# Patient Record
Sex: Female | Born: 1990 | Race: Black or African American | Hispanic: No | Smoking: Never smoker
Health system: Southern US, Community
[De-identification: ages and names within clinical notes are randomized; demographics above are authoritative.]

## PROBLEM LIST (undated history)

## (undated) ENCOUNTER — Inpatient Hospital Stay (HOSPITAL_COMMUNITY): Payer: Self-pay

## (undated) DIAGNOSIS — F419 Anxiety disorder, unspecified: Secondary | ICD-10-CM

## (undated) DIAGNOSIS — J45909 Unspecified asthma, uncomplicated: Secondary | ICD-10-CM

## (undated) HISTORY — PX: NO PAST SURGERIES: SHX2092

## (undated) HISTORY — DX: Anxiety disorder, unspecified: F41.9

---
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
## 2009-10-14 ENCOUNTER — Emergency Department (HOSPITAL_COMMUNITY): Admission: EM | Admit: 2009-10-14 | Discharge: 2009-10-14 | Payer: Self-pay | Admitting: Emergency Medicine

## 2010-09-03 LAB — URINE MICROSCOPIC-ADD ON

## 2010-09-03 LAB — URINALYSIS, ROUTINE W REFLEX MICROSCOPIC
Bilirubin Urine: NEGATIVE
Specific Gravity, Urine: >1.03 — ABNORMAL HIGH (ref 1.005–1.030)

## 2010-09-03 LAB — URINE CULTURE

## 2010-09-03 LAB — PREGNANCY, URINE: Preg Test, Ur: NEGATIVE

## 2010-10-23 ENCOUNTER — Emergency Department (HOSPITAL_COMMUNITY)
Admission: EM | Admit: 2010-10-23 | Discharge: 2010-10-23 | Disposition: A | Discharge status: Home / Self Care | Payer: Self-pay | Attending: Emergency Medicine | Admitting: Emergency Medicine

## 2010-10-23 DIAGNOSIS — Z79899 Other long term (current) drug therapy: Secondary | ICD-10-CM | POA: Insufficient documentation

## 2010-10-23 DIAGNOSIS — J45909 Unspecified asthma, uncomplicated: Secondary | ICD-10-CM | POA: Insufficient documentation

## 2010-10-23 DIAGNOSIS — N390 Urinary tract infection, site not specified: Secondary | ICD-10-CM | POA: Insufficient documentation

## 2010-10-23 LAB — URINALYSIS, ROUTINE W REFLEX MICROSCOPIC
Bilirubin Urine: NEGATIVE
Glucose, UA: NEGATIVE mg/dL
Ketones, ur: NEGATIVE mg/dL
Nitrite: POSITIVE — AB
Urobilinogen, UA: 1 mg/dL (ref 0.0–1.0)

## 2010-10-23 LAB — URINE MICROSCOPIC-ADD ON

## 2010-10-25 LAB — URINE CULTURE

## 2011-03-21 LAB — URINALYSIS, ROUTINE W REFLEX MICROSCOPIC
Bilirubin Urine: NEGATIVE
Ketones, ur: NEGATIVE
Leukocytes, UA: NEGATIVE
Protein, ur: 30 — AB
Urobilinogen, UA: 0.2

## 2011-03-21 LAB — URINE MICROSCOPIC-ADD ON

## 2011-03-21 LAB — PREGNANCY, URINE: Preg Test, Ur: NEGATIVE

## 2012-01-20 ENCOUNTER — Other Ambulatory Visit (HOSPITAL_COMMUNITY)
Admission: RE | Admit: 2012-01-20 | Discharge: 2012-01-20 | Disposition: A | Discharge status: Home / Self Care | Payer: Medicaid Other | Source: Ambulatory Visit | Attending: Obstetrics and Gynecology | Admitting: Obstetrics and Gynecology

## 2012-01-20 ENCOUNTER — Other Ambulatory Visit: Payer: Self-pay | Admitting: Adult Health

## 2012-01-20 DIAGNOSIS — Z01419 Encounter for gynecological examination (general) (routine) without abnormal findings: Secondary | ICD-10-CM | POA: Insufficient documentation

## 2012-01-20 DIAGNOSIS — Z113 Encounter for screening for infections with a predominantly sexual mode of transmission: Secondary | ICD-10-CM | POA: Insufficient documentation

## 2012-01-20 LAB — OB RESULTS CONSOLE VARICELLA ZOSTER ANTIBODY, IGG: Varicella: IMMUNE

## 2012-01-20 LAB — OB RESULTS CONSOLE RUBELLA ANTIBODY, IGM: Rubella: IMMUNE

## 2012-01-20 LAB — OB RESULTS CONSOLE ABO/RH: RH Type: POSITIVE

## 2012-02-01 ENCOUNTER — Emergency Department (HOSPITAL_COMMUNITY)
Admission: EM | Admit: 2012-02-01 | Discharge: 2012-02-01 | Disposition: A | Discharge status: Home / Self Care | Payer: Medicaid Other | Attending: Emergency Medicine | Admitting: Emergency Medicine

## 2012-02-01 ENCOUNTER — Encounter (HOSPITAL_COMMUNITY): Payer: Self-pay | Admitting: *Deleted

## 2012-02-01 DIAGNOSIS — O209 Hemorrhage in early pregnancy, unspecified: Secondary | ICD-10-CM | POA: Insufficient documentation

## 2012-02-01 DIAGNOSIS — O200 Threatened abortion: Secondary | ICD-10-CM

## 2012-02-01 DIAGNOSIS — Z87891 Personal history of nicotine dependence: Secondary | ICD-10-CM | POA: Insufficient documentation

## 2012-02-01 HISTORY — DX: Unspecified asthma, uncomplicated: J45.909

## 2012-02-01 LAB — URINALYSIS, ROUTINE W REFLEX MICROSCOPIC
Bilirubin Urine: NEGATIVE
Glucose, UA: NEGATIVE mg/dL
Ketones, ur: NEGATIVE mg/dL
Nitrite: NEGATIVE
Specific Gravity, Urine: >1.03 — ABNORMAL HIGH (ref 1.005–1.030)

## 2012-02-01 LAB — URINE MICROSCOPIC-ADD ON

## 2012-02-01 LAB — HCG, QUANTITATIVE, PREGNANCY: hCG, Beta Chain, Quant, S: 75264 m[IU]/mL — ABNORMAL HIGH (ref <5)

## 2012-02-01 MED ORDER — ONDANSETRON HCL 4 MG PO TABS: 4.0000 mg | ORAL_TABLET | Freq: Four times a day (QID) | ORAL | Status: AC

## 2012-02-01 NOTE — ED Notes (Signed)
Pt states she is [redacted] wks pregnant and wiped after using the restroom and saw a small amount of bright red blood on the tissue. Pt being treated for a uti.

## 2012-02-01 NOTE — ED Notes (Signed)
Ultrasound machine at bedside as requested.

## 2012-02-01 NOTE — ED Provider Notes (Signed)
History     CSN: [id number]  Arrival date & time 02/01/12  0012   First MD Initiated Contact with Patient 02/01/12 0033      Chief Complaint  Patient presents with  . Vaginal Bleeding    (Consider location/radiation/quality/duration/timing/severity/associated sxs/prior treatment) HPI Hx per PT. After urinating tonight noticed dark blood while wiping.  No vag bleeding otherwise, is being treated for UTI with ABx (not sure what she is taking - it is BID). No F/C, no back pain, no change in mild vag discharge. No ABd pain or cramping, is about 8 weeks preg followed by Dr Emelda Fear. HAs had [place] alrwady. G1P0.  No syncope or dizzieness. mild in severity. No h/o same. No dysuria/ urgency or frequency, some nausea no vomiting.   Past Medical History  Diagnosis Date  . Asthma     History reviewed. No pertinent past surgical history.  History reviewed. No pertinent family history.  History  Substance Use Topics  . Smoking status: Former Games developer  . Smokeless tobacco: Not on file  . Alcohol Use: No    OB History    Grav Para Term Preterm Abortions TAB SAB Ect Mult Living   1               Review of Systems  Constitutional: Negative for fever and chills.  HENT: Negative for neck pain and neck stiffness.   Eyes: Negative for pain.  Respiratory: Negative for shortness of breath.   Cardiovascular: Negative for chest pain.  Gastrointestinal: Negative for abdominal pain.  Genitourinary: Negative for dysuria.  Musculoskeletal: Negative for back pain.  Skin: Negative for rash.  Neurological: Negative for headaches.  All other systems reviewed and are negative.    Allergies  Review of patient's allergies indicates no known allergies.  Home Medications   Current Outpatient Rx  Name Route Sig Dispense Refill  . PRENATAL MULTIVITAMIN CH Oral Take 1 tablet by mouth daily.    Marland Kitchen UNKNOWN TO PATIENT        BP 151/67  Pulse 93  Temp 98 F (36.7 C)  Resp 20  Ht 5\' 5"  (1.651 m)   Wt 230 lb (104.327 kg)  BMI 38.27 kg/m2  LMP 12/03/2011  Physical Exam  Constitutional: She is oriented to person, place, and time. She appears well-developed and well-nourished.  HENT:  Head: Normocephalic and atraumatic.  Eyes: Conjunctivae and EOM are normal. Pupils are equal, round, and reactive to light.  Neck: Trachea normal. Neck supple. No thyromegaly present.  Cardiovascular: Normal rate, regular rhythm, S1 normal, S2 normal and normal pulses.     No systolic murmur is present   No diastolic murmur is present  Pulses:      Radial pulses are 2+ on the right side, and 2+ on the left side.  Pulmonary/Chest: Effort normal and breath sounds normal. She has no wheezes. She has no rhonchi. She has no rales. She exhibits no tenderness.  Abdominal: Soft. Normal appearance and bowel sounds are normal. There is no tenderness. There is no rebound, no guarding, no CVA tenderness and negative Murphy's sign.  Musculoskeletal:       BLE:s Calves nontender, no cords or erythema, negative Homans sign  Neurological: She is alert and oriented to person, place, and time. She has normal strength. No cranial nerve deficit or sensory deficit. GCS eye subscore is 4. GCS verbal subscore is 5. GCS motor subscore is 6.  Skin: Skin is warm and dry. No rash noted. She is not diaphoretic.  Psychiatric: Her speech is normal.       Cooperative and appropriate    ED Course  [place] bedside Date/Time: 02/01/2012 1:28 AM Performed by: Sunnie Nielsen Authorized by: Sunnie Nielsen Consent: Verbal consent obtained. Risks and benefits: risks, benefits and alternatives were discussed Consent given by: patient Patient understanding: patient states understanding of the procedure being performed Patient consent: the patient's understanding of the procedure matches consent given Procedure consent: procedure consent matches procedure scheduled Required items: required blood products, implants, devices, and special equipment  available Patient identity confirmed: verbally with patient Time out: Immediately prior to procedure a "time out" was called to verify the correct patient, procedure, equipment, support staff and site/side marked as required. Patient tolerance: Patient tolerated the procedure well with no immediate complications. Comments: IUP present c/w dates. Cardiac activity present. - calipers unavailable and unable to measure HR with [place] machine.     Results for orders placed during the hospital encounter of 02/01/12  URINALYSIS, ROUTINE W REFLEX MICROSCOPIC      Component Value Range   Color, Urine YELLOW  YELLOW   APPearance CLEAR  CLEAR   Specific Gravity, Urine >1.030 (*) 1.005 - 1.030   pH 6.0  5.0 - 8.0   Glucose, UA NEGATIVE  NEGATIVE mg/dL   Hgb urine dipstick SMALL (*) NEGATIVE   Bilirubin Urine NEGATIVE  NEGATIVE   Ketones, ur NEGATIVE  NEGATIVE mg/dL   Protein, ur NEGATIVE  NEGATIVE mg/dL   Urobilinogen, UA 0.2  0.0 - 1.0 mg/dL   Nitrite NEGATIVE  NEGATIVE   Leukocytes, UA MODERATE (*) NEGATIVE  HCG, QUANTITATIVE, PREGNANCY      Component Value Range   hCG, Beta Chain, Quant, S >10000 (*) <5 mIU/mL  URINE MICROSCOPIC-ADD ON      Component Value Range   Squamous Epithelial / LPF MANY (*) RARE   WBC, UA 7-10  <3 WBC/hpf   RBC / HPF 3-6  <3 RBC/hpf   Bacteria, UA FEW (*) RARE   Urine-Other CLUE CELLS PRESENT     FHTs 151-153  HCG and UA as above - contaminated UA  Stable for d/c home threatened miscarriage and UTI precautions verbalized as understood. PT states understanding that she needs to follow up with OB once she is in her second trimester for Flagyl given clue cells present. MDM   Nursing notes and VS reviewed. Quant hCG obtained given symptoms of threatened miscarriage - too high to follow.   RX zofran as needed. Plan cont ABx for UTI.    Clue cells noted, is first trimester, plan OB GYN follow up for recs and treatment of clue cells once in her second  trimester      Sunnie Nielsen, MD 02/01/12 0246

## 2012-06-16 NOTE — L&D Delivery Note (Signed)
Operative Delivery Note At  a viable and healthy female was delivered via Kiwi vacuum assisted vaginal delivery by Dr Jolayne Panther.  Delivered over 2 contractions with no pop-offs. Presentation: vertex; Position: Occiput,, Anterior; Station: +5.  Verbal consent: obtained from patient.  Risks and benefits discussed in detail.  Risks include, but are not limited to the risks of anesthesia, bleeding, infection, damage to maternal tissues, fetal cephalhematoma.  There is also the risk of inability to effect vaginal delivery of the head, or shoulder dystocia that cannot be resolved by established maneuvers, leading to the need for emergency cesarean section.  No difficulty with shoulders.   APGAR: , ; weight .   Placenta status: Spontaneously and grossly intact with 3VC    with the following complications: Marland Kitchen Variable decels during second stage due to tight nuchal cord x 1  Anesthesia:  Epidural with local for repair Instruments: Kiwi Vacuum Episiotomy: None Lacerations: 2nd degree left sulcus and left labial, repaired.  There is swelling on left labia, possible hematoma, but not discolored. Will observe Suture Repair: 3.0 monocryl Est. Blood Loss 250 (mL):   Mom to postpartum.  Baby to nursery-stable.  [facility] 08/26/2012, 11:13 AM

## 2012-06-16 NOTE — L&D Delivery Note (Signed)
Attestation of Attending Supervision of Advanced Practitioner (CNM/NP): Evaluation and management procedures were performed by the Advanced Practitioner under my supervision and collaboration.  I have reviewed the Advanced Practitioner's note and chart, and I agree with the management and plan.  Angela Mcclure 08/26/2012 11:51 PM

## 2012-06-27 ENCOUNTER — Encounter (HOSPITAL_COMMUNITY): Payer: Self-pay | Admitting: *Deleted

## 2012-06-27 ENCOUNTER — Inpatient Hospital Stay (HOSPITAL_COMMUNITY)
Admission: AD | Admit: 2012-06-27 | Discharge: 2012-06-28 | Disposition: A | Discharge status: Home / Self Care | Payer: Medicaid Other | Source: Ambulatory Visit | Attending: Obstetrics & Gynecology | Admitting: Obstetrics & Gynecology

## 2012-06-27 DIAGNOSIS — M545 Low back pain, unspecified: Secondary | ICD-10-CM | POA: Insufficient documentation

## 2012-06-27 DIAGNOSIS — N390 Urinary tract infection, site not specified: Secondary | ICD-10-CM

## 2012-06-27 DIAGNOSIS — O2390 Unspecified genitourinary tract infection in pregnancy, unspecified trimester: Secondary | ICD-10-CM | POA: Insufficient documentation

## 2012-06-27 LAB — URINALYSIS, ROUTINE W REFLEX MICROSCOPIC
Glucose, UA: NEGATIVE mg/dL
Ketones, ur: NEGATIVE mg/dL
Nitrite: POSITIVE — AB
Protein, ur: 30 mg/dL — AB
pH: 6 (ref 5.0–8.0)

## 2012-06-27 LAB — URINE MICROSCOPIC-ADD ON

## 2012-06-27 MED ORDER — NITROFURANTOIN MONOHYD MACRO 100 MG PO CAPS: 100.0000 mg | ORAL_CAPSULE | Freq: Two times a day (BID) | ORAL | Status: DC

## 2012-06-27 NOTE — MAU Note (Signed)
Pt reports pain in pelvic area and "a lot of back pain" since last pm, denies dysuria, denies vaginal bleeding or discharge.

## 2012-06-27 NOTE — MAU Provider Note (Signed)
Chief Complaint:  No chief complaint on file.  HPI: Angela Mcclure is a 22 y.o. G1P0 at 52.4 who presents to maternity admissions reporting lower back pain for the last day.  This has been ongoing for one day, constant, sometimes sharp/sometimes dull without radiation.  Pt denies hematuria, dysuria, abdominal pain, nausea/vomiting/diarrhea/constipation.  No fevers, chills, SOB, CP.    Denies contractions, leakage of fluid or vaginal bleeding. Good fetal movement.   Pregnancy Course: No complications noted to this point.   Past Medical History: Past Medical History  Diagnosis Date  . Asthma     Past obstetric history: OB History    Grav Para Term Preterm Abortions TAB SAB Ect Mult Living   1              # Outc Date GA Lbr Len/2nd Wgt Sex Del Anes PTL Lv   1 CUR               Past Surgical History: No past surgical history on file.  Family History: No family history on file.  Social History: History  Substance Use Topics  . Smoking status: Former Games developer  . Smokeless tobacco: Not on file  . Alcohol Use: No    Allergies: No Known Allergies  Meds:  Prescriptions prior to admission  Medication Sig Dispense Refill  . Prenatal Vit-Fe Fumarate-FA (PRENATAL MULTIVITAMIN) TABS Take 1 tablet by mouth daily.      Marland Kitchen UNKNOWN TO PATIENT         ROS: Pertinent findings in history of present illness.  Physical Exam  Last menstrual period 12/03/2011. GENERAL: Well-developed, well-nourished female in no acute distress.  HEENT: normocephalic HEART: RRR RESP: CTA ABDOMEN: Soft, non-tender, gravid appropriate for gestational age.  No CVA tenderness, no suprapubic tenderness  EXTREMITIES: Nontender, no edema NEURO: alert and oriented    FHT:  Baseline 140 , moderate variability, accelerations present, no decelerations Contractions: None    Labs: Results for orders placed during the hospital encounter of 06/27/12 (from the past 24 hour(s))  URINALYSIS, ROUTINE W REFLEX  MICROSCOPIC     Status: Abnormal   Collection Time   06/27/12 10:57 PM      Component Value Range   Color, Urine YELLOW  YELLOW   APPearance CLEAR  CLEAR   Specific Gravity, Urine 1.025  1.005 - 1.030   pH 6.0  5.0 - 8.0   Glucose, UA NEGATIVE  NEGATIVE mg/dL   Hgb urine dipstick MODERATE (*) NEGATIVE   Bilirubin Urine NEGATIVE  NEGATIVE   Ketones, ur NEGATIVE  NEGATIVE mg/dL   Protein, ur 30 (*) NEGATIVE mg/dL   Urobilinogen, UA 0.2  0.0 - 1.0 mg/dL   Nitrite POSITIVE (*) NEGATIVE   Leukocytes, UA MODERATE (*) NEGATIVE  URINE MICROSCOPIC-ADD ON     Status: Abnormal   Collection Time   06/27/12 10:57 PM      Component Value Range   Squamous Epithelial / LPF FEW (*) RARE   WBC, UA 21-50  <3 WBC/hpf   RBC / HPF 21-50  <3 RBC/hpf   Bacteria, UA MANY (*) RARE    Imaging:  No results found. MAU Course: 1)FHT reactive, Category 1.    Assessment: 1. UTI (lower urinary tract infection)     Plan: Discharge home Macrobid 100 mg BID x 7 days Labor precautions and fetal kick counts    Medication List     As of 06/27/2012 11:50 PM    TAKE these medications  nitrofurantoin (macrocrystal-monohydrate) 100 MG capsule   Commonly known as: MACROBID   Take 1 capsule (100 mg total) by mouth 2 (two) times daily.      prenatal multivitamin Tabs   Take 1 tablet by mouth daily.      UNKNOWN TO PATIENT         Bryan R. Hess, DO of [facility] 06/27/2012, 11:50 PM  I have seen the patient with the resident and agree with the plan of care.

## 2012-06-28 MED ORDER — NITROFURANTOIN MONOHYD MACRO 100 MG PO CAPS: 100.0000 mg | ORAL_CAPSULE | Freq: Two times a day (BID) | ORAL | Status: DC

## 2012-06-29 LAB — URINE CULTURE: Colony Count: >=100000

## 2012-08-14 ENCOUNTER — Encounter (HOSPITAL_COMMUNITY): Payer: Self-pay

## 2012-08-14 ENCOUNTER — Emergency Department (HOSPITAL_COMMUNITY)
Admission: EM | Admit: 2012-08-14 | Discharge: 2012-08-14 | Disposition: A | Discharge status: Home / Self Care | Payer: Medicaid Other | Attending: Emergency Medicine | Admitting: Emergency Medicine

## 2012-08-14 DIAGNOSIS — R5381 Other malaise: Secondary | ICD-10-CM | POA: Insufficient documentation

## 2012-08-14 DIAGNOSIS — J45909 Unspecified asthma, uncomplicated: Secondary | ICD-10-CM | POA: Insufficient documentation

## 2012-08-14 DIAGNOSIS — B9689 Other specified bacterial agents as the cause of diseases classified elsewhere: Secondary | ICD-10-CM

## 2012-08-14 DIAGNOSIS — R5383 Other fatigue: Secondary | ICD-10-CM | POA: Insufficient documentation

## 2012-08-14 DIAGNOSIS — E669 Obesity, unspecified: Secondary | ICD-10-CM | POA: Insufficient documentation

## 2012-08-14 DIAGNOSIS — N390 Urinary tract infection, site not specified: Secondary | ICD-10-CM | POA: Insufficient documentation

## 2012-08-14 DIAGNOSIS — R51 Headache: Secondary | ICD-10-CM | POA: Insufficient documentation

## 2012-08-14 DIAGNOSIS — Z3490 Encounter for supervision of normal pregnancy, unspecified, unspecified trimester: Secondary | ICD-10-CM

## 2012-08-14 DIAGNOSIS — N760 Acute vaginitis: Secondary | ICD-10-CM | POA: Insufficient documentation

## 2012-08-14 DIAGNOSIS — O2390 Unspecified genitourinary tract infection in pregnancy, unspecified trimester: Secondary | ICD-10-CM | POA: Insufficient documentation

## 2012-08-14 LAB — URINE MICROSCOPIC-ADD ON

## 2012-08-14 LAB — URINALYSIS, ROUTINE W REFLEX MICROSCOPIC
Bilirubin Urine: NEGATIVE
Ketones, ur: NEGATIVE mg/dL
Nitrite: NEGATIVE
Specific Gravity, Urine: 1.025 (ref 1.005–1.030)
Urobilinogen, UA: 1 mg/dL (ref 0.0–1.0)

## 2012-08-14 MED ORDER — METRONIDAZOLE 0.75 % VA GEL: 1.0000 | Freq: Two times a day (BID) | VAGINAL | Status: DC

## 2012-08-14 MED ORDER — DEXTROSE 5 % IV SOLN
1.0000 g | Freq: Once | INTRAVENOUS | Status: AC
  Administered 2012-08-14: 1 g via INTRAVENOUS
  Filled 2012-08-14: qty 10

## 2012-08-14 MED ORDER — SODIUM CHLORIDE 0.9 % IV BOLUS (SEPSIS)
1000.0000 mL | Freq: Once | INTRAVENOUS | Status: AC
  Administered 2012-08-14: 1000 mL via INTRAVENOUS

## 2012-08-14 MED ORDER — AMOXICILLIN 500 MG PO CAPS: 500.0000 mg | ORAL_CAPSULE | Freq: Three times a day (TID) | ORAL | Status: DC

## 2012-08-14 MED ORDER — ACETAMINOPHEN 500 MG PO TABS
1000.0000 mg | ORAL_TABLET | Freq: Once | ORAL | Status: AC
  Administered 2012-08-14: 1000 mg via ORAL
  Filled 2012-08-14: qty 2

## 2012-08-14 NOTE — ED Provider Notes (Signed)
History    This chart was scribed for Donnetta Hutching, MD by Leone Payor, ED Scribe. This patient was seen in room APA07/APA07 and the patient's care was started 6:17 PM.   CSN: [id number]  Arrival date & time 08/14/12  1806   First MD Initiated Contact with Patient 08/14/12 1813      Chief Complaint  Patient presents with  . Chills     The history is provided by the patient and a parent. No language interpreter was used.    Angela Mcclure is a 22 y.o. female who presents to the Emergency Department complaining of new episode of chills with an associated HA and generalized weakness that lasted for an hour that started about 2.5 hours ago. [facility] 09/08/12. Pt reports having decreased appetite. She denies dysuria, vaginal discharge, vaginal bleeding. Nothing makes symptoms better or worse. Severity is mild.  Pt has h/o asthma.  Pt denies smoking and alcohol use.  Past Medical History  Diagnosis Date  . Asthma     Past Surgical History  Procedure Laterality Date  . No past surgeries      No family history on file.  History  Substance Use Topics  . Smoking status: Never Smoker   . Smokeless tobacco: Not on file  . Alcohol Use: No    OB History   Grav Para Term Preterm Abortions TAB SAB Ect Mult Living   1               Review of Systems A complete 10 system review of systems was obtained and all systems are negative except as noted in the HPI and PMH.   Allergies  Review of patient's allergies indicates no known allergies.  Home Medications   Current Outpatient Rx  Name  Route  Sig  Dispense  Refill  . nitrofurantoin, macrocrystal-monohydrate, (MACROBID) 100 MG capsule   Oral   Take 1 capsule (100 mg total) by mouth 2 (two) times daily.   14 capsule   0   . Prenatal Vit-Fe Fumarate-FA (PRENATAL MULTIVITAMIN) TABS   Oral   Take 1 tablet by mouth daily.         Marland Kitchen UNKNOWN TO PATIENT                 BP 139/65  Pulse 117  Temp(Src) 98.9 F (37.2 C) (Oral)   Resp 20  Ht 5\' 5"  (1.651 m)  Wt 271 lb (122.925 kg)  BMI 45.1 kg/m2  SpO2 100%  LMP 12/03/2011  Physical Exam  Nursing note and vitals reviewed. Constitutional: She is oriented to person, place, and time. She appears well-developed and well-nourished.  Obese  HENT:  Head: Normocephalic and atraumatic.  Eyes: Conjunctivae and EOM are normal. Pupils are equal, round, and reactive to light.  Neck: Normal range of motion. Neck supple.  Cardiovascular: Normal rate, regular rhythm and normal heart sounds.   Pulmonary/Chest: Effort normal and breath sounds normal.  Abdominal: Soft. Bowel sounds are normal. There is no tenderness.  Musculoskeletal: Normal range of motion.  Neurological: She is alert and oriented to person, place, and time.  Skin: Skin is warm and dry.  Psychiatric: She has a normal mood and affect.    ED Course  Procedures (including critical care time)  DIAGNOSTIC STUDIES: Oxygen Saturation is 100% on room air, normal by my interpretation.    COORDINATION OF CARE: 6:37 PM Discussed treatment plan which includes IV fluids, UA with pt at bedside and pt agreed to plan.  Labs Reviewed  URINALYSIS, ROUTINE W REFLEX MICROSCOPIC - Abnormal; Notable for the following:    APPearance HAZY (*)    Leukocytes, UA SMALL (*)    All other components within normal limits  URINE MICROSCOPIC-ADD ON - Abnormal; Notable for the following:    Squamous Epithelial / LPF MANY (*)    Bacteria, UA MANY (*)    All other components within normal limits  URINE CULTURE   No results found.   No diagnosis found.    MDM  Patient is nontoxic. IV Rocephin for urinary tract infection. Urine culture. Discharge home on amoxicillin and metronidazole gel for bacterial vaginosis.      I personally performed the services described in this documentation, which was scribed in my presence. The recorded information has been reviewed and is accurate.    Donnetta Hutching, MD 08/14/12 2055

## 2012-08-14 NOTE — ED Notes (Signed)
Pt states she has not had any problems with her pregnacy

## 2012-08-14 NOTE — ED Notes (Signed)
Complain of chills and headache for an hour

## 2012-08-14 NOTE — ED Notes (Signed)
Patient with no complaints at this time. Respirations even and unlabored. Skin warm/dry. Discharge instructions reviewed with patient at this time. Patient given opportunity to voice concerns/ask questions.Reviewed my chart.  IV removed per policy and band-aid applied to site. Patient discharged at this time and left Emergency Department with steady gait.

## 2012-08-17 LAB — URINE CULTURE: Colony Count: >=100000

## 2012-08-18 ENCOUNTER — Encounter: Payer: Self-pay | Admitting: *Deleted

## 2012-08-18 DIAGNOSIS — J45909 Unspecified asthma, uncomplicated: Secondary | ICD-10-CM | POA: Insufficient documentation

## 2012-08-18 NOTE — ED Notes (Signed)
+  Urine Patient treated with Amoxil-sensitive to same-chart appended per protocol MD.

## 2012-08-24 ENCOUNTER — Encounter (HOSPITAL_COMMUNITY): Payer: Self-pay | Admitting: *Deleted

## 2012-08-24 ENCOUNTER — Inpatient Hospital Stay (HOSPITAL_COMMUNITY)
Admission: AD | Admit: 2012-08-24 | Discharge: 2012-08-28 | DRG: 775 | Disposition: A | Discharge status: Home / Self Care | Payer: Medicaid Other | Source: Ambulatory Visit | Attending: Obstetrics and Gynecology | Admitting: Obstetrics and Gynecology

## 2012-08-24 DIAGNOSIS — N390 Urinary tract infection, site not specified: Secondary | ICD-10-CM | POA: Diagnosis present

## 2012-08-24 DIAGNOSIS — O139 Gestational [pregnancy-induced] hypertension without significant proteinuria, unspecified trimester: Principal | ICD-10-CM | POA: Diagnosis present

## 2012-08-24 DIAGNOSIS — O2390 Unspecified genitourinary tract infection in pregnancy, unspecified trimester: Secondary | ICD-10-CM | POA: Diagnosis present

## 2012-08-24 DIAGNOSIS — O4290 Premature rupture of membranes, unspecified as to length of time between rupture and onset of labor, unspecified weeks of gestation: Secondary | ICD-10-CM | POA: Diagnosis present

## 2012-08-24 DIAGNOSIS — O4202 Full-term premature rupture of membranes, onset of labor within 24 hours of rupture: Secondary | ICD-10-CM | POA: Diagnosis present

## 2012-08-24 NOTE — MAU Note (Signed)
Pt states her water started leaking yesterday about 0800. Pt denies pain at this time

## 2012-08-25 ENCOUNTER — Encounter (HOSPITAL_COMMUNITY): Payer: Self-pay | Admitting: *Deleted

## 2012-08-25 ENCOUNTER — Encounter (HOSPITAL_COMMUNITY): Payer: Self-pay | Admitting: Anesthesiology

## 2012-08-25 ENCOUNTER — Inpatient Hospital Stay (HOSPITAL_COMMUNITY): Payer: Medicaid Other | Admitting: Anesthesiology

## 2012-08-25 LAB — WET PREP, GENITAL
Clue Cells Wet Prep HPF POC: NONE SEEN
Trich, Wet Prep: NONE SEEN
WBC, Wet Prep HPF POC: NONE SEEN
Yeast Wet Prep HPF POC: NONE SEEN

## 2012-08-25 LAB — RPR: RPR Ser Ql: NONREACTIVE

## 2012-08-25 LAB — TYPE AND SCREEN
ABO/RH(D): A POS
Antibody Screen: NEGATIVE

## 2012-08-25 LAB — COMPREHENSIVE METABOLIC PANEL
AST: 15 U/L (ref 0–37)
Albumin: 2.8 g/dL — ABNORMAL LOW (ref 3.5–5.2)
Alkaline Phosphatase: 71 U/L (ref 39–117)
BUN: 7 mg/dL (ref 6–23)
Chloride: 102 mEq/L (ref 96–112)
Creatinine, Ser: 0.71 mg/dL (ref 0.50–1.10)
Potassium: 4.1 mEq/L (ref 3.5–5.1)
Total Protein: 6.7 g/dL (ref 6.0–8.3)

## 2012-08-25 LAB — PROTEIN / CREATININE RATIO, URINE
Creatinine, Urine: 109.44 mg/dL
Total Protein, Urine: 7.9 mg/dL

## 2012-08-25 LAB — ABO/RH: ABO/RH(D): A POS

## 2012-08-25 LAB — CBC
Hemoglobin: 10.6 g/dL — ABNORMAL LOW (ref 12.0–15.0)
MCH: 24.3 pg — ABNORMAL LOW (ref 26.0–34.0)
MCHC: 32.1 g/dL (ref 30.0–36.0)
Platelets: 345 10*3/uL (ref 150–400)
RDW: 16.9 % — ABNORMAL HIGH (ref 11.5–15.5)

## 2012-08-25 LAB — GROUP B STREP BY PCR: Group B strep by PCR: NEGATIVE

## 2012-08-25 MED ORDER — LACTATED RINGERS IV SOLN
INTRAVENOUS | Status: DC
  Administered 2012-08-25 – 2012-08-26 (×3): via INTRAVENOUS

## 2012-08-25 MED ORDER — MISOPROSTOL 25 MCG QUARTER TABLET
25.0000 ug | ORAL_TABLET | ORAL | Status: DC | PRN
  Administered 2012-08-25 (×2): 25 ug via VAGINAL
  Filled 2012-08-25 (×3): qty 0.25

## 2012-08-25 MED ORDER — FENTANYL 2.5 MCG/ML BUPIVACAINE 1/10 % EPIDURAL INFUSION (WH - ANES): 14.0000 mL/h | INTRAMUSCULAR | Status: DC | PRN

## 2012-08-25 MED ORDER — ACETAMINOPHEN 325 MG PO TABS: 650.0000 mg | ORAL_TABLET | ORAL | Status: DC | PRN

## 2012-08-25 MED ORDER — OXYCODONE-ACETAMINOPHEN 5-325 MG PO TABS: 1.0000 | ORAL_TABLET | ORAL | Status: DC | PRN

## 2012-08-25 MED ORDER — IBUPROFEN 600 MG PO TABS: 600.0000 mg | ORAL_TABLET | Freq: Four times a day (QID) | ORAL | Status: DC | PRN

## 2012-08-25 MED ORDER — TERBUTALINE SULFATE 1 MG/ML IJ SOLN: 0.2500 mg | Freq: Once | INTRAMUSCULAR | Status: AC | PRN

## 2012-08-25 MED ORDER — LACTATED RINGERS IV SOLN
500.0000 mL | INTRAVENOUS | Status: DC | PRN
  Administered 2012-08-25 – 2012-08-26 (×2): 1000 mL via INTRAVENOUS

## 2012-08-25 MED ORDER — LACTATED RINGERS IV SOLN
500.0000 mL | Freq: Once | INTRAVENOUS | Status: AC
  Administered 2012-08-25: 500 mL via INTRAVENOUS

## 2012-08-25 MED ORDER — OXYTOCIN 40 UNITS IN LACTATED RINGERS INFUSION - SIMPLE MED
1.0000 m[IU]/min | INTRAVENOUS | Status: DC
  Administered 2012-08-25: 16 m[IU]/min via INTRAVENOUS
  Administered 2012-08-25: 14 m[IU]/min via INTRAVENOUS
  Administered 2012-08-25 (×2): 2 m[IU]/min via INTRAVENOUS
  Administered 2012-08-26: 8 m[IU]/min via INTRAVENOUS
  Administered 2012-08-26: 10 m[IU]/min via INTRAVENOUS
  Administered 2012-08-26: 6 m[IU]/min via INTRAVENOUS
  Filled 2012-08-25: qty 1000

## 2012-08-25 MED ORDER — PHENYLEPHRINE 40 MCG/ML (10ML) SYRINGE FOR IV PUSH (FOR BLOOD PRESSURE SUPPORT)
80.0000 ug | PREFILLED_SYRINGE | INTRAVENOUS | Status: DC | PRN
  Filled 2012-08-25: qty 5

## 2012-08-25 MED ORDER — EPHEDRINE 5 MG/ML INJ
10.0000 mg | INTRAVENOUS | Status: DC | PRN
  Filled 2012-08-25: qty 4

## 2012-08-25 MED ORDER — EPHEDRINE 5 MG/ML INJ: 10.0000 mg | INTRAVENOUS | Status: DC | PRN

## 2012-08-25 MED ORDER — PHENYLEPHRINE 40 MCG/ML (10ML) SYRINGE FOR IV PUSH (FOR BLOOD PRESSURE SUPPORT): 80.0000 ug | PREFILLED_SYRINGE | INTRAVENOUS | Status: DC | PRN

## 2012-08-25 MED ORDER — LIDOCAINE HCL (PF) 1 % IJ SOLN
30.0000 mL | INTRAMUSCULAR | Status: AC | PRN
  Administered 2012-08-26: 30 mL via SUBCUTANEOUS
  Filled 2012-08-25 (×2): qty 30

## 2012-08-25 MED ORDER — CITRIC ACID-SODIUM CITRATE 334-500 MG/5ML PO SOLN
30.0000 mL | ORAL | Status: DC | PRN
  Filled 2012-08-25: qty 15

## 2012-08-25 MED ORDER — OXYTOCIN BOLUS FROM INFUSION: 500.0000 mL | INTRAVENOUS | Status: DC

## 2012-08-25 MED ORDER — FENTANYL CITRATE 0.05 MG/ML IJ SOLN
100.0000 ug | INTRAMUSCULAR | Status: DC | PRN
  Administered 2012-08-25 (×2): 100 ug via INTRAVENOUS
  Filled 2012-08-25 (×2): qty 2

## 2012-08-25 MED ORDER — OXYTOCIN 40 UNITS IN LACTATED RINGERS INFUSION - SIMPLE MED
62.5000 mL/h | INTRAVENOUS | Status: DC
  Administered 2012-08-26: 62.5 mL/h via INTRAVENOUS

## 2012-08-25 MED ORDER — LIDOCAINE HCL (PF) 1 % IJ SOLN
INTRAMUSCULAR | Status: DC | PRN
  Administered 2012-08-25 (×2): 4 mL

## 2012-08-25 MED ORDER — FENTANYL 2.5 MCG/ML BUPIVACAINE 1/10 % EPIDURAL INFUSION (WH - ANES)
14.0000 mL/h | INTRAMUSCULAR | Status: DC | PRN
  Administered 2012-08-26 (×2): 14 mL/h via EPIDURAL
  Filled 2012-08-25 (×3): qty 125

## 2012-08-25 MED ORDER — DIPHENHYDRAMINE HCL 50 MG/ML IJ SOLN: 12.5000 mg | INTRAMUSCULAR | Status: DC | PRN

## 2012-08-25 MED ORDER — ONDANSETRON HCL 4 MG/2ML IJ SOLN
4.0000 mg | Freq: Four times a day (QID) | INTRAMUSCULAR | Status: DC | PRN
  Administered 2012-08-26: 4 mg via INTRAVENOUS
  Filled 2012-08-25: qty 2

## 2012-08-25 MED ORDER — FENTANYL 2.5 MCG/ML BUPIVACAINE 1/10 % EPIDURAL INFUSION (WH - ANES)
INTRAMUSCULAR | Status: DC | PRN
  Administered 2012-08-25: 14 mL/h via EPIDURAL

## 2012-08-25 NOTE — Progress Notes (Signed)
Angela Mcclure is a 22 y.o. G1P0 at [redacted]w[redacted]d  admitted for PROM  Subjective: Feeling painful contractions. Has requested epidural  Objective: BP 121/73  Pulse 63  Temp(Src) 97.7 F (36.5 C) (Oral)  Resp 20  Ht 5\' 5"  (1.651 m)  Wt 122.925 kg (271 lb)  BMI 45.1 kg/m2  SpO2 100%  LMP 12/03/2011      FHT:  FHR: 130 bpm, variability: moderate,  accelerations:  Present,  decelerations:  Absent UC:   regular, every 2-3 minutes SVE:   Dilation: 4.5 Effacement (%): 80 Station: Ballotable Exam by:: Lorretta Harp RNC  Labs: Lab Results  Component Value Date   WBC 11.8* 08/25/2012   HGB 10.6* 08/25/2012   HCT 33.0* 08/25/2012   MCV 75.5* 08/25/2012   PLT 345 08/25/2012    Assessment / Plan: Induction of labor due to PROM,  progressing well on pitocin  Labor: Progressing normally Fetal Wellbeing:  Category I Pain Control:  awaiting epidural placement Anticipated MOD:  NSVD  [name] 08/25/2012, 4:40 PM

## 2012-08-25 NOTE — Progress Notes (Signed)
I examined pt and agree with documentation above and nurse midwife student plan of care. [name],Jaislyn Blinn  

## 2012-08-25 NOTE — Progress Notes (Signed)
I examined pt and agree with documentation above and nurse midwife student plan of care. [name],Angela Mcclure  

## 2012-08-25 NOTE — Anesthesia Procedure Notes (Signed)
Epidural Patient location during procedure: OB Start time: 08/25/2012 4:36 PM  Staffing Anesthesiologist: Loree Shehata A. Performed by: anesthesiologist   Preanesthetic Checklist Completed: patient identified, site marked, surgical consent, pre-op evaluation, timeout performed, IV checked, risks and benefits discussed and monitors and equipment checked  Epidural Patient position: sitting Prep: site prepped and draped and DuraPrep Patient monitoring: continuous pulse ox and blood pressure Approach: midline Injection technique: LOR air  Needle:  Needle type: Tuohy  Needle gauge: 17 G Needle length: 9 cm and 9 Needle insertion depth: 8 cm Catheter type: closed end flexible Catheter size: 19 Gauge Catheter at skin depth: 14 cm Test dose: negative and Other  Assessment Events: blood not aspirated, injection not painful, no injection resistance, negative IV test and no paresthesia  Additional Notes Patient identified. Risks and benefits discussed including failed block, incomplete  Pain control, post dural puncture headache, nerve damage, paralysis, blood pressure Changes, nausea, vomiting, reactions to medications-both toxic and allergic and post Partum back pain. All questions were answered. Patient expressed understanding and wished to proceed. Sterile technique was used throughout procedure. Epidural site was Dressed with sterile barrier dressing. No paresthesias, signs of intravascular injection Or signs of intrathecal spread were encountered.  Patient was more comfortable after the epidural was dosed. Please see RN's note for documentation of vital signs and FHR which are stable.

## 2012-08-25 NOTE — Progress Notes (Signed)
Angela Mcclure is a 22 y.o. G1P0 at [redacted]w[redacted]d  admitted for PROM  Subjective: Feeling painful contractions in back prior to epidural. Has requested epidural  Objective: BP 121/73  Pulse 63  Temp(Src) 97.7 F (36.5 C) (Oral)  Resp 20  Ht 5\' 5"  (1.651 m)  Wt 122.925 kg (271 lb)  BMI 45.1 kg/m2  SpO2 100%  LMP 12/03/2011      FHT:  FHR: 130 bpm, variability: moderate,  accelerations:  Present,  decelerations:  Absent UC:   regular, every 2-3 minutes SVE:   Dilation: 4.5 Effacement (%): 80 Station: Ballotable Exam by:: Gregor Hams  Labs: Lab Results  Component Value Date   WBC 11.8* 08/25/2012   HGB 10.6* 08/25/2012   HCT 33.0* 08/25/2012   MCV 75.5* 08/25/2012   PLT 345 08/25/2012    Assessment / Plan: Induction of labor due to PROM,  progressing well on pitocin -- plan for AROM when head engaged   Labor: Progressing normally Fetal Wellbeing:  Category I Pain Control:  epidural  Anticipated MOD:  NSVD  [name] 08/25/2012, 4:40 PM

## 2012-08-25 NOTE — Progress Notes (Signed)
ZELIA YZAGUIRRE is a 22 y.o. G1P0 at [redacted]w[redacted]d admitted for PROM  Subjective:  Sleeping. Per RN, was recently given Fentanyl IV for painful contractions  Objective: BP 109/49  Pulse 74  Temp(Src) 98.1 F (36.7 C) (Oral)  Resp 18  Ht 5\' 5"  (1.651 m)  Wt 122.925 kg (271 lb)  BMI 45.1 kg/m2  SpO2 99%  LMP 12/03/2011      FHT:  FHR: 135 bpm, variability: moderate,  accelerations:  Present,  decelerations:  Absent UC:   regular, every2-3 minutes SVE:  Deferred, was 3/60/ballottable at last check by RN Labs: Lab Results  Component Value Date   WBC 11.8* 08/25/2012   HGB 10.6* 08/25/2012   HCT 33.0* 08/25/2012   MCV 75.5* 08/25/2012   PLT 345 08/25/2012    Assessment / Plan: Induction of labor due to PROM,  progressing well on pitocin  Labor: Progressing normally Fetal Wellbeing:  Category I Pain Control:  Fentanyl I/D:  GBS negative Anticipated MOD:  NSVD  [name] 08/25/2012, 2:03 PM

## 2012-08-25 NOTE — Progress Notes (Signed)
Angela Mcclure is a 22 y.o. G1P0 at [redacted]w[redacted]d  Subjective: Comfortable with epidural  Objective: BP 102/50  Pulse 68  Temp(Src) 97.9 F (36.6 C) (Oral)  Resp 20  Ht 5\' 5"  (1.651 m)  Wt 271 lb (122.925 kg)  BMI 45.1 kg/m2  SpO2 100%  LMP 12/03/2011      FHT:  FHR: 120 bpm, variability: moderate,  accelerations:  Present,  decelerations:  Present called to see pt for variables to 90s; O2 appled, Pit off, bolus started; resolved to stable after SROM of forebad during exam UC:   regular, every 2-3 minutes SVE:  8/90/vtx -2, SROM of forebag for clear fluid during exam; internal monitors placed  Labs: Lab Results  Component Value Date   WBC 11.8* 08/25/2012   HGB 10.6* 08/25/2012   HCT 33.0* 08/25/2012   MCV 75.5* 08/25/2012   PLT 345 08/25/2012    Assessment / Plan: Active labor FHR changes- stable for now  Will leave Pitocin off x 1 hr and restart if MVUs not adequate Continue to watch FHR tracing Anticipate SVD   SHAW, KIMBERLY 08/25/2012, 11:55 PM

## 2012-08-25 NOTE — Progress Notes (Signed)
VENDELA TROUNG is a 22 y.o. G1P0 at [redacted]w[redacted]d  admitted for PROM  Subjective:  Not feeling contractions No complaints  Objective: BP 103/42  Pulse 61  Temp(Src) 97.8 F (36.6 C) (Oral)  Resp 18  SpO2 99%  LMP 12/03/2011      FHT:  FHR: 140 bpm, variability: moderate,  accelerations:  Present,  decelerations:  Absent UC:   irregular, every 2-5 minutes SVE:  Deferred at this time Labs: Lab Results  Component Value Date   WBC 11.8* 08/25/2012   HGB 10.6* 08/25/2012   HCT 33.0* 08/25/2012   MCV 75.5* 08/25/2012   PLT 345 08/25/2012    Assessment / Plan: Induction of labor due to PROM, next Cytotec at 1030 Gestational Hypertension  Labor: Contracting irregularly, but not feeling them Preeclampsia:  urine protein/creatinine ratio 0.07 Fetal Wellbeing:  Category I Pain Control:  Labor support without medications I/D:  GBS neg Anticipated MOD:  NSVD  [name] 08/25/2012, 9:59 AM

## 2012-08-25 NOTE — Progress Notes (Signed)
Angela Mcclure is a 22 y.o. G1P0 at [redacted]w[redacted]d admitted for PROM  Subjective: Not feeling contractions Discussed moving to Foley bulb or starting Pitocin, pt desires to try Pitocin first  Objective: BP 139/72  Pulse 67  Temp(Src) 98.5 F (36.9 C) (Oral)  Resp 18  Ht 5\' 5"  (1.651 m)  Wt 122.925 kg (271 lb)  BMI 45.1 kg/m2  SpO2 99%  LMP 12/03/2011      FHT:  FHR: 130 bpm, variability: moderate,  accelerations:  Present,  decelerations:  Absent UC:   irregular, every 2-5 minutes SVE:   2-3/60/-2 by Angela Mcclure, student nurse-midwife Labs: Lab Results  Component Value Date   WBC 11.8* 08/25/2012   HGB 10.6* 08/25/2012   HCT 33.0* 08/25/2012   MCV 75.5* 08/25/2012   PLT 345 08/25/2012    Assessment / Plan: Has progressed to 2-3cm on Cytotec, will proceed to Pitocin  Labor: Will start Pitocin at 84mu/min Fetal Wellbeing:  Category I Pain Control:  not feeling pain I/D:  GBS negative Anticipated MOD:  NSVD  Angela Mcclure 08/25/2012, 10:46 AM

## 2012-08-25 NOTE — Anesthesia Preprocedure Evaluation (Signed)
Anesthesia Evaluation  Patient identified by MRN, date of birth, ID band Patient awake    Reviewed: Allergy & Precautions, H&P , Patient's Chart, lab work & pertinent test results  Airway Mallampati: III TM Distance: >3 FB Neck ROM: full    Dental no notable dental hx. (+) Teeth Intact   Pulmonary asthma ,  breath sounds clear to auscultation  Pulmonary exam normal       Cardiovascular negative cardio ROS  Rhythm:regular Rate:Normal     Neuro/Psych negative neurological ROS  negative psych ROS   GI/Hepatic negative GI ROS, Neg liver ROS,   Endo/Other  Morbid obesity  Renal/GU negative Renal ROS  negative genitourinary   Musculoskeletal   Abdominal   Peds  Hematology negative hematology ROS (+)   Anesthesia Other Findings   Reproductive/Obstetrics (+) Pregnancy                           Anesthesia Physical Anesthesia Plan  ASA: III  Anesthesia Plan: Epidural   Post-op Pain Management:    Induction:   Airway Management Planned:   Additional Equipment:   Intra-op Plan:   Post-operative Plan:   Informed Consent: I have reviewed the patients History and Physical, chart, labs and discussed the procedure including the risks, benefits and alternatives for the proposed anesthesia with the patient or authorized representative who has indicated his/her understanding and acceptance.     Plan Discussed with: Anesthesiologist  Anesthesia Plan Comments:         Anesthesia Quick Evaluation

## 2012-08-25 NOTE — Progress Notes (Signed)
I examined pt and agree with documentation above and nurse midwife student plan of care. [name],WALIDAH  

## 2012-08-25 NOTE — H&P (Signed)
Angela Mcclure is a 22 y.o. female with hx of GHTN and UTI presenting with SROM. Maternal Medical History:  Reason for admission: Rupture of membranes.  Nausea.  Contractions: Onset was 6-12 hours ago.   Perceived severity is mild.    Fetal activity: Perceived fetal activity is normal.   Last perceived fetal movement was within the past hour.    Prenatal complications: PIH and pre-eclampsia (hx of hypertension).   No bleeding, HIV, IUGR, oligohydramnios, placental abnormality, polyhydramnios or substance abuse.   Prenatal Complications - Diabetes: none.    ROM: Pt mentions SROM on 08/24/12 at 0800 described as clear fluid from the vagina without contractions or abdominal pain.  Pt mention feeling of continued fetal movement, no bleeding or discharge.  Pt denies pain with urination or increased urinary frequency.    OB History   Grav Para Term Preterm Abortions TAB SAB Ect Mult Living   1              Past Medical History  Diagnosis Date  . Asthma    Past Surgical History  Procedure Laterality Date  . No past surgeries     Family History: family history includes Diabetes in her other; Hypertension in her other; and Stroke in her other. Social History:  reports that she has never smoked. She does not have any smokeless tobacco history on file. She reports that she does not drink alcohol or use illicit drugs.   Prenatal Transfer Tool  Maternal Diabetes: No Genetic Screening: Normal Maternal Ultrasounds/Referrals: Normal Fetal Ultrasounds or other Referrals:  None Maternal Substance Abuse:  No Significant Maternal Medications:  Meds include: Other: Amoxacillin 500 mg po qday for UTI Significant Maternal Lab Results:  Lab values include: Other:  Other Comments:  Amniosure +; GBS pending  Review of Systems  Constitutional: Negative for fever.  HENT: Negative for sore throat and neck pain.   Eyes: Negative for blurred vision and double vision.  Respiratory: Negative for  cough and wheezing.   Cardiovascular: Negative for chest pain and palpitations.  Gastrointestinal: Negative for heartburn, nausea, vomiting, abdominal pain and diarrhea.  Genitourinary: Negative for dysuria and urgency.  Musculoskeletal: Negative for myalgias and joint pain.  Skin: Negative for itching and rash.  Neurological: Negative for dizziness, tingling and headaches.  Endo/Heme/Allergies: Negative for environmental allergies. Does not bruise/bleed easily.  All other systems reviewed and are negative.      Blood pressure 133/61, pulse 88, temperature 99.1 F (37.3 C), temperature source Oral, resp. rate 18, last menstrual period 12/03/2011, SpO2 100.00%. Maternal Exam:  Uterine Assessment: Contraction strength is mild.  Contraction duration is 10 minutes. Contraction frequency is irregular.   Introitus: Normal vulva. Normal vagina.  Ferning test: positive.  Amniotic fluid character: not assessed.  Cervix: Cervix evaluated by digital exam.     Physical Exam  Constitutional: She is oriented to person, place, and time. She appears well-developed and well-nourished.  HENT:  Head: Normocephalic and atraumatic.  Eyes: Conjunctivae and EOM are normal. Pupils are equal, round, and reactive to light.  Neck: Normal range of motion. Neck supple.  Cardiovascular: Normal rate, regular rhythm, normal heart sounds and intact distal pulses.   Respiratory: Effort normal and breath sounds normal.  GI: Soft. Bowel sounds are normal. There is no tenderness. There is no rebound and no CVA tenderness.  Genitourinary: Vagina normal and uterus normal.  Musculoskeletal: Normal range of motion. She exhibits edema (1+ edema). She exhibits no tenderness.  Neurological: She is alert  and oriented to person, place, and time. She has normal reflexes.  Skin: Skin is warm and dry.  Psychiatric: She has a normal mood and affect. Her behavior is normal.    Prenatal labs: ABO, Rh: A/Positive/-- (08/06  0000) Antibody: Negative (08/06 0000) Rubella: Immune (08/06 0000) RPR: Nonreactive (12/31 0000)  HBsAg: Negative (08/06 0000)  HIV: Non-reactive (12/31 0000)  GBS:   pending  Assessment/Plan: 22 y/o G1P0 w/ recent UTI presenting with SROM   #SROM: -- rapid GBS -- pt will be admitted to L&D for anticipated SNVD -- pt requests epidural placement  # GHTN -- urine protein/creatinne ratio  DISPO: Diet: thin Activity: up ab lib Nursing: Per floor protocol Electrolytes: replace as needed Prophylaxis: GI: none; DVT early ambulation Code: Full  Ancipitate the pt to be admitted for 2 midnight(s) for SNVD.     Gregor Hams 08/25/2012, 1:40 AM    G1 at [redacted]w[redacted]d with essentially uncomplicated PN course at [facility] is admitted for PROM x 16 hrs and unfavorable cx (post/closed/long): GBS neg> plan cervical ripening with cytotec  Evaluation and management procedures were performed by Resident physician under my supervision/collaboration. Chart reviewed, patient examined by me and I agree with management and plan.

## 2012-08-26 ENCOUNTER — Encounter (HOSPITAL_COMMUNITY): Payer: Self-pay | Admitting: *Deleted

## 2012-08-26 DIAGNOSIS — O4202 Full-term premature rupture of membranes, onset of labor within 24 hours of rupture: Secondary | ICD-10-CM | POA: Diagnosis present

## 2012-08-26 DIAGNOSIS — O139 Gestational [pregnancy-induced] hypertension without significant proteinuria, unspecified trimester: Secondary | ICD-10-CM

## 2012-08-26 DIAGNOSIS — O2390 Unspecified genitourinary tract infection in pregnancy, unspecified trimester: Secondary | ICD-10-CM

## 2012-08-26 DIAGNOSIS — N390 Urinary tract infection, site not specified: Secondary | ICD-10-CM

## 2012-08-26 MED ORDER — LANOLIN HYDROUS EX OINT: TOPICAL_OINTMENT | CUTANEOUS | Status: DC | PRN

## 2012-08-26 MED ORDER — IBUPROFEN 600 MG PO TABS
600.0000 mg | ORAL_TABLET | Freq: Four times a day (QID) | ORAL | Status: DC
  Administered 2012-08-26 – 2012-08-28 (×9): 600 mg via ORAL
  Filled 2012-08-26 (×9): qty 1

## 2012-08-26 MED ORDER — SENNOSIDES-DOCUSATE SODIUM 8.6-50 MG PO TABS
2.0000 | ORAL_TABLET | Freq: Every day | ORAL | Status: DC
  Administered 2012-08-26 – 2012-08-27 (×2): 2 via ORAL

## 2012-08-26 MED ORDER — BENZOCAINE-MENTHOL 20-0.5 % EX AERO
1.0000 "application " | INHALATION_SPRAY | CUTANEOUS | Status: DC | PRN
  Administered 2012-08-27: 1 via TOPICAL
  Filled 2012-08-26: qty 56

## 2012-08-26 MED ORDER — DIPHENHYDRAMINE HCL 25 MG PO CAPS: 25.0000 mg | ORAL_CAPSULE | Freq: Four times a day (QID) | ORAL | Status: DC | PRN

## 2012-08-26 MED ORDER — OXYCODONE-ACETAMINOPHEN 5-325 MG PO TABS
1.0000 | ORAL_TABLET | ORAL | Status: DC | PRN
  Administered 2012-08-27: 1 via ORAL
  Filled 2012-08-26: qty 1

## 2012-08-26 MED ORDER — DIBUCAINE 1 % RE OINT: 1.0000 "application " | TOPICAL_OINTMENT | RECTAL | Status: DC | PRN

## 2012-08-26 MED ORDER — AMOXICILLIN 500 MG PO CAPS
500.0000 mg | ORAL_CAPSULE | Freq: Three times a day (TID) | ORAL | Status: DC
  Administered 2012-08-26 – 2012-08-28 (×6): 500 mg via ORAL
  Filled 2012-08-26 (×9): qty 1

## 2012-08-26 MED ORDER — WITCH HAZEL-GLYCERIN EX PADS: 1.0000 "application " | MEDICATED_PAD | CUTANEOUS | Status: DC | PRN

## 2012-08-26 MED ORDER — SIMETHICONE 80 MG PO CHEW: 80.0000 mg | CHEWABLE_TABLET | ORAL | Status: DC | PRN

## 2012-08-26 MED ORDER — ONDANSETRON HCL 4 MG/2ML IJ SOLN: 4.0000 mg | INTRAMUSCULAR | Status: DC | PRN

## 2012-08-26 MED ORDER — PNEUMOCOCCAL VAC POLYVALENT 25 MCG/0.5ML IJ INJ
0.5000 mL | INJECTION | INTRAMUSCULAR | Status: AC
  Administered 2012-08-27: 0.5 mL via INTRAMUSCULAR
  Filled 2012-08-26: qty 0.5

## 2012-08-26 MED ORDER — PRENATAL MULTIVITAMIN CH
1.0000 | ORAL_TABLET | Freq: Every day | ORAL | Status: DC
  Administered 2012-08-27: 1 via ORAL
  Filled 2012-08-26: qty 1

## 2012-08-26 MED ORDER — ZOLPIDEM TARTRATE 5 MG PO TABS: 5.0000 mg | ORAL_TABLET | Freq: Every evening | ORAL | Status: DC | PRN

## 2012-08-26 MED ORDER — ONDANSETRON HCL 4 MG PO TABS: 4.0000 mg | ORAL_TABLET | ORAL | Status: DC | PRN

## 2012-08-26 MED ORDER — TETANUS-DIPHTH-ACELL PERTUSSIS 5-2.5-18.5 LF-MCG/0.5 IM SUSP
0.5000 mL | Freq: Once | INTRAMUSCULAR | Status: AC
  Administered 2012-08-28: 0.5 mL via INTRAMUSCULAR
  Filled 2012-08-26: qty 0.5

## 2012-08-26 NOTE — Progress Notes (Signed)
Del of viable female infant using vacuume extractor. Peds team present.

## 2012-08-26 NOTE — Progress Notes (Signed)
Angela Mcclure is a 22 y.o. G1P0 at [redacted]w[redacted]d SOL Subjective: Comfortable with epidural, increasing pelvic discomfort and increasing urge to push  Objective: BP 135/72  Pulse 59  Temp(Src) 98.7 F (37.1 C) (Oral)  Resp 20  Ht 5\' 5"  (1.651 m)  Wt 122.925 kg (271 lb)  BMI 45.1 kg/m2  SpO2 100%  LMP 12/03/2011   Total I/O In: -  Out: 800 [Urine:800]  FHT:  FHR: 130 bpm, variability: marked,  accelerations:  Present,  decelerations:  Present late UC:   regular, every 2-3 minutes SVE:  9.5/90/vtx -1, internal monitors still in place  Labs: Lab Results  Component Value Date   WBC 11.8* 08/25/2012   HGB 10.6* 08/25/2012   HCT 33.0* 08/25/2012   MCV 75.5* 08/25/2012   PLT 345 08/25/2012    Assessment / Plan: Active labor -- cn't to f/u NST -- mom on O2  Pitocin augmentation has resumed Continue to watch FHR tracing Anticipate SVD   Gregor Hams 08/26/2012, 6:46 AM

## 2012-08-26 NOTE — Progress Notes (Signed)
Angela Mcclure is a 22 y.o. G1P0 at [redacted]w[redacted]d by ultrasound admitted for rupture of membranes  Subjective:   Objective: BP 163/65  Pulse 102  Temp(Src) 98.2 F (36.8 C) (Oral)  Resp 20  Ht 5\' 5"  (1.651 m)  Wt 271 lb (122.925 kg)  BMI 45.1 kg/m2  SpO2 100%  LMP 12/03/2011 I/O last 3 completed shifts: In: -  Out: 800 [Urine:800]    FHT:  FHR: 140 bpm, variability: minimal ,  accelerations:  Present,  decelerations:  Present variable with contractions/pushes UC:   regular, every 2 minutes SVE:   Dilation: 10 Effacement (%): 100 Station: +3 Exam by::  (dr. Sherryl Barters)  Labs: Lab Results  Component Value Date   WBC 11.8* 08/25/2012   HGB 10.6* 08/25/2012   HCT 33.0* 08/25/2012   MCV 75.5* 08/25/2012   PLT 345 08/25/2012    Assessment / Plan: SIUP at [redacted]w[redacted]d  Second Stage Pushing well to +2 station.  Labor: Progressing normally Preeclampsia:  n/a Fetal Wellbeing:  Category II Pain Control:  Epidural I/D:  n/a Anticipated MOD:  NSVD  WILLIAMS,MARIE 08/26/2012, 9:12 AM

## 2012-08-26 NOTE — Progress Notes (Signed)
Angela Mcclure is a 22 y.o. G1P0 at [redacted]w[redacted]d SOL Subjective: Comfortable with epidural, denies urge to push or bear down.  Pt was sleeping on entering the room  Objective: BP 114/56  Pulse 85  Temp(Src) 97.9 F (36.6 C) (Oral)  Resp 20  Ht 5\' 5"  (1.651 m)  Wt 122.925 kg (271 lb)  BMI 45.1 kg/m2  SpO2 100%  LMP 12/03/2011   Total I/O In: -  Out: 800 [Urine:800]  FHT:  FHR: 130 bpm, variability: moderate,  accelerations:  Present,  decelerations:  Absent UC:   regular, every 2-3 minutes SVE:  8/90/vtx -3, internal monitors still in place  Labs: Lab Results  Component Value Date   WBC 11.8* 08/25/2012   HGB 10.6* 08/25/2012   HCT 33.0* 08/25/2012   MCV 75.5* 08/25/2012   PLT 345 08/25/2012    Assessment / Plan: Active labor FHR changes- stable for now  Pitocin augmentation has resumed Continue to watch FHR tracing Anticipate SVD   Gregor Hams 08/26/2012, 1:32 AM

## 2012-08-26 NOTE — H&P (Signed)

## 2012-08-26 NOTE — Progress Notes (Signed)
I examined pt and agree with documentation above and nurse midwife student plan of care. [name],Kaden Dunkel  

## 2012-08-26 NOTE — Anesthesia Postprocedure Evaluation (Signed)
  Anesthesia Post-op Note  Patient: Angela Mcclure  Procedure(s) Performed: * No procedures listed *  Patient Location: PACU and Mother/Baby  Anesthesia Type:Epidural  Level of Consciousness: awake, alert  and oriented  Airway and Oxygen Therapy: Patient Spontanous Breathing  Post-op Pain: none  Post-op Assessment: Post-op Vital signs reviewed, Patient's Cardiovascular Status Stable, No headache, No backache, No residual numbness and No residual motor weakness  Post-op Vital Signs: Reviewed and stable  Complications: No apparent anesthesia complications

## 2012-08-27 NOTE — Progress Notes (Signed)
UR chart review completed.  

## 2012-08-27 NOTE — Discharge Summary (Signed)
Obstetric Discharge Summary  No complaints. Tolerating PO. +voiding, -BM or flatus  Reason for Admission: onset of labor Prenatal Procedures: none Intrapartum Procedures: vacuum Postpartum Procedures: none Complications-Operative and Postpartum: none Hemoglobin  Date Value Range Status  08/25/2012 10.6* 12.0 - 15.0 g/dL Final     HCT  Date Value Range Status  08/25/2012 33.0* 36.0 - 46.0 % Final    Physical Exam:  General: alert, cooperative, appears stated age and no distress Lochia: Did not assess Uterine Fundus: firm Incision: N/A DVT Evaluation: No evidence of DVT seen on physical exam. Negative Homan's sign. No cords or calf tenderness.  Discharge Diagnoses: Term Pregnancy-delivered  Discharge Information: Date: 08/27/2012 Activity: unrestricted Diet: routine Medications: Ibuprofen Condition: stable Instructions: refer to practice specific booklet Discharge to: home   Newborn Data: Live born female  Birth Weight: 6 lb 1 oz (2750 g) APGAR: 6, 9  Home with partner.  Lorna Dibble, PA-S 08/27/2012, 7:30 AM  I saw and examined patient along with student and agree with above note.   FRAZIER,NATALIE 08/27/2012 4:22 PM

## 2012-08-28 MED ORDER — IBUPROFEN 600 MG PO TABS: 600.0000 mg | ORAL_TABLET | Freq: Four times a day (QID) | ORAL | Status: DC

## 2012-08-28 NOTE — Discharge Summary (Signed)
Obstetric Discharge Summary Reason for Admission: rupture of membranes Prenatal Procedures: none Intrapartum Procedures: vacuum Postpartum Procedures: none Complications-Operative and Postpartum: 2nd degree L sulcus and labial repair Hemoglobin  Date Value Range Status  08/25/2012 10.6* 12.0 - 15.0 g/dL Final     HCT  Date Value Range Status  08/25/2012 33.0* 36.0 - 46.0 % Final   Pt was admitted at approx 1am on 3/12 with reports of leaking clear fluid since 0800 on 3/11. Upon admission her cx was unfavorable and required serial cytotec dosing followed by Pitocin. At approx 2200 on 3/12 she had a substantial forebag that was ruputured and at that time she was 8cm and internal monitors were placed. By the morning she had progressed to complete and began pushing. A vacuum assisted delivery was required (see note). Pt remained afebrile thorough out her labor/postpartum stay.  Physical Exam:  General: alert, cooperative and no distress Lungs: nl effort Heart: RRR Lochia: appropriate Uterine Fundus: firm DVT Evaluation: No evidence of DVT seen on physical exam.  Discharge Diagnoses: Term Pregnancy-delivered and PROM x51 hours  Discharge Information: Date: 08/28/2012 Activity: pelvic rest Diet: routine Medications: PNV and Ibuprofen Condition: stable Instructions: refer to practice specific booklet Discharge to: home Follow-up Information   Follow up with [facility]. (Make a 6 week postpartum appointment)    Contact information:   [address] [place] [address] [phone]      Newborn Data: Live born female  Birth Weight: 6 lb 1 oz (2750 g) APGAR: 6, 9  Home with mother. Bottlefeeding; plans condoms for contraception (rev'd the need to discuss at her PP appt using something more reliable).  Cam Hai 08/28/2012, 7:15 AM

## 2012-08-29 NOTE — Discharge Summary (Signed)
Attestation of Attending Supervision of Advanced Practitioner (CNM/NP): Evaluation and management procedures were performed by the Advanced Practitioner under my supervision and collaboration.  I have reviewed the Advanced Practitioner's note and chart, and I agree with the management and plan.  HARRAWAY-SMITH, Seward Coran 1:56 PM

## 2012-10-07 ENCOUNTER — Ambulatory Visit: Payer: Self-pay | Admitting: Obstetrics & Gynecology

## 2012-10-14 ENCOUNTER — Ambulatory Visit: Payer: Self-pay | Admitting: Obstetrics and Gynecology

## 2012-10-18 ENCOUNTER — Ambulatory Visit (INDEPENDENT_AMBULATORY_CARE_PROVIDER_SITE_OTHER): Payer: Medicaid Other | Admitting: Obstetrics & Gynecology

## 2012-10-18 ENCOUNTER — Encounter: Payer: Self-pay | Admitting: Obstetrics & Gynecology

## 2012-10-28 NOTE — Progress Notes (Signed)
Patient ID: Angela Mcclure, female   DOB: April 03, 1991, 22 y.o.   MRN: [id number] Post partum visit after NSVD vacuum assisted No complaints Depression scale noted Exam  Normal vulvovaginal exam Uterus normal involution Adnexa negative  Normal exam Pt to decide on [facility]

## 2013-06-16 NOTE — L&D Delivery Note (Signed)
Delivery Note At 5:16 PM a viable female was delivered via Vaginal, Spontaneous Delivery (Presentation: loa;  ).  APGAR:9 ,9 ; weight  .   Placenta status:spont , .  Cord:3vc  with the following complications:none .  Cord pH: n/a  Anesthesia: Epidural  Episiotomy:  none Lacerations:  none Suture Repair: n/a Est. Blood Loss (mL):    Mom to postpartum.  Baby to Couplet care / Skin to Skin.  Angela Mcclure, [name] 04/23/2014, 5:24 PM

## 2013-10-03 ENCOUNTER — Other Ambulatory Visit: Payer: Self-pay | Admitting: Obstetrics & Gynecology

## 2013-10-03 DIAGNOSIS — O3680X0 Pregnancy with inconclusive fetal viability, not applicable or unspecified: Secondary | ICD-10-CM

## 2013-10-04 ENCOUNTER — Ambulatory Visit: Payer: Medicaid Other

## 2013-10-07 ENCOUNTER — Ambulatory Visit (INDEPENDENT_AMBULATORY_CARE_PROVIDER_SITE_OTHER): Payer: Medicaid Other

## 2013-10-07 DIAGNOSIS — O3680X0 Pregnancy with inconclusive fetal viability, not applicable or unspecified: Secondary | ICD-10-CM

## 2013-10-07 NOTE — Progress Notes (Signed)
U/S(9+4wks)-single IUP with +FCA noted, FHR- 176 bpm, CRL c/w LMP dates, cx appears long and closed, bilateral adnexa appears WNL with C.L. Noted on Rt=2.5cm no free fluid noted within pelvis

## 2013-10-18 ENCOUNTER — Ambulatory Visit (INDEPENDENT_AMBULATORY_CARE_PROVIDER_SITE_OTHER): Payer: Medicaid Other | Admitting: Women's Health

## 2013-10-18 ENCOUNTER — Encounter: Payer: Self-pay | Admitting: Women's Health

## 2013-10-18 VITALS — BP 140/64 | Wt 268.6 lb

## 2013-10-18 DIAGNOSIS — O99210 Obesity complicating pregnancy, unspecified trimester: Secondary | ICD-10-CM

## 2013-10-18 DIAGNOSIS — Z3480 Encounter for supervision of other normal pregnancy, unspecified trimester: Secondary | ICD-10-CM | POA: Insufficient documentation

## 2013-10-18 DIAGNOSIS — O2390 Unspecified genitourinary tract infection in pregnancy, unspecified trimester: Secondary | ICD-10-CM

## 2013-10-18 DIAGNOSIS — Z331 Pregnant state, incidental: Secondary | ICD-10-CM

## 2013-10-18 DIAGNOSIS — E669 Obesity, unspecified: Secondary | ICD-10-CM | POA: Insufficient documentation

## 2013-10-18 DIAGNOSIS — O99019 Anemia complicating pregnancy, unspecified trimester: Secondary | ICD-10-CM

## 2013-10-18 DIAGNOSIS — Z1389 Encounter for screening for other disorder: Secondary | ICD-10-CM

## 2013-10-18 DIAGNOSIS — B3731 Acute candidiasis of vulva and vagina: Secondary | ICD-10-CM

## 2013-10-18 DIAGNOSIS — B373 Candidiasis of vulva and vagina: Secondary | ICD-10-CM

## 2013-10-18 DIAGNOSIS — Z8759 Personal history of other complications of pregnancy, childbirth and the puerperium: Secondary | ICD-10-CM

## 2013-10-18 HISTORY — DX: Personal history of other complications of pregnancy, childbirth and the puerperium: Z87.59

## 2013-10-18 LAB — POCT URINALYSIS DIPSTICK
Glucose, UA: NEGATIVE
KETONES UA: NEGATIVE
Leukocytes, UA: NEGATIVE
NITRITE UA: NEGATIVE
RBC UA: NEGATIVE

## 2013-10-18 LAB — CBC
HEMATOCRIT: 32.9 % — AB (ref 36.0–46.0)
HEMOGLOBIN: 10.7 g/dL — AB (ref 12.0–15.0)
MCH: 22.6 pg — AB (ref 26.0–34.0)
MCHC: 32.5 g/dL (ref 30.0–36.0)
MCV: 69.6 fL — AB (ref 78.0–100.0)
Platelets: 266 10*3/uL (ref 150–400)
RBC: 4.73 MIL/uL (ref 3.87–5.11)
RDW: 17.9 % — ABNORMAL HIGH (ref 11.5–15.5)
WBC: 8.9 10*3/uL (ref 4.0–10.5)

## 2013-10-18 MED ORDER — FLUCONAZOLE 150 MG PO TABS: 150.0000 mg | ORAL_TABLET | Freq: Once | ORAL | Status: DC

## 2013-10-18 NOTE — Progress Notes (Signed)
  Subjective:  Angela Mcclure is a 23 y.o. 472P1001 African American female at 6664w1d by LMP c/w 9.4wk u/s being seen today for her first obstetrical visit.  Her obstetrical history is significant for obesity and pregnancy induced hypertension.  States bp's began to elevated towards end of pregnancy, was not dx w/ pre-e. Denies h/o HTN prior to pregancy. Had VAVD march 2014. Pregnancy history fully reviewed.  Patient reports nausea, but getting better- declines antiemetics. + white/clumpy d/c w/ vulvovaginal itching x 3wks. Denies vb, cramping, uti s/s.                                                                                            BP 140/64  Wt 268 lb 9.6 oz (121.836 kg)  LMP 08/01/2013  HISTORY: OB History  Gravida Para Term Preterm AB SAB TAB Ectopic Multiple Living  2 1 1       1     # Outcome Date GA Lbr Len/2nd Weight Sex Delivery Anes PTL Lv  2 CUR           1 TRM 08/26/12 4941w1d 29:33 / 02:21 6 lb 1 oz (2.75 kg) M VAC EPI  Y     Past Medical History  Diagnosis Date  . Asthma    Past Surgical History  Procedure Laterality Date  . No past surgeries     Family History  Problem Relation Age of Onset  . Diabetes Other   . Hypertension Other   . Stroke Other     Exam   System:     General: Well developed & nourished, no acute distress   Skin: Warm & dry, normal coloration and turgor, no rashes   Neurologic: Alert & oriented, normal mood   Cardiovascular: Regular rate & rhythm   Respiratory: Effort & rate normal, LCTAB, acyanotic   Abdomen: Soft, non tender   Extremities: normal strength, tone   Pelvic Exam:    Perineum: Normal perineum   Vulva: Normal, no lesions   Vagina:  Thick white clumpy d/c c/w yeast   Cervix: Normal, bulbous, appears closed   Uterus: Normal size/shape/contour for GA   Thin prep pap smear neg 01/20/12  FHR: 160 via doppler   Assessment:   Pregnancy: G2P1001 Patient Active Problem List   Diagnosis Date Noted  . Supervision of  other normal pregnancy 10/18/2013    Priority: High  . Asthma 08/18/2012    2964w1d G2P1001 New OB visit Vulvovaginal candida Obesity H/O GHTN, BP elevated today  Plan:  Initial labs drawn Continue prenatal vitamins Problem list reviewed and updated Reviewed n/v relief measures and warning s/s to report Reviewed recommended weight gain based on pre-gravid BMI Encouraged well-balanced diet Genetic Screening discussed Integrated Screen: requested Cystic fibrosis screening discussed requested Ultrasound discussed; fetal survey: requested Follow up in 1 weeks for 1st IT/NT and visit to recheck bp- will do 24hr urine protein baseline if still elevated CCNC completed Rx diflucan per pt request  Marge DuncansKimberly Randall Jalise Zawistowski CNM, WHNP-BC 10/18/2013 10:36 AM

## 2013-10-18 NOTE — Patient Instructions (Signed)
Nausea & Vomiting  Have saltine crackers or pretzels by your bed and eat a few bites before you raise your head out of bed in the morning  Eat small frequent meals throughout the day instead of large meals  Drink plenty of fluids throughout the day to stay hydrated, just don't drink a lot of fluids with your meals.  This can make your stomach fill up faster making you feel sick  Do not brush your teeth right after you eat  Products with real ginger are good for nausea, like ginger ale and ginger hard candy Make sure it says made with real ginger!  Sucking on sour candy like lemon heads is also good for nausea  If your prenatal vitamins make you nauseated, take them at night so you will sleep through the nausea  If you feel like you need medicine for the nausea & vomiting please let us know  If you are unable to keep any fluids or food down please let us know    Pregnancy - First Trimester During sexual intercourse, millions of sperm go into the vagina. Only 1 sperm will penetrate and fertilize the female egg while it is in the Fallopian tube. One week later, the fertilized egg implants into the wall of the uterus. An embryo begins to develop into a baby. At 6 to 8 weeks, the eyes and face are formed and the heartbeat can be seen on ultrasound. At the end of 12 weeks (first trimester), all the baby's organs are formed. Now that you are pregnant, you will want to do everything you can to have a healthy baby. Two of the most important things are to get good prenatal care and follow your caregiver's instructions. Prenatal care is all the medical care you receive before the baby's birth. It is given to prevent, find, and treat problems during the pregnancy and childbirth. PRENATAL EXAMS  During prenatal visits, your weight, blood pressure, and urine are checked. This is done to make sure you are healthy and progressing normally during the pregnancy.  A pregnant woman should gain 25 to 35 pounds  during the pregnancy. However, if you are overweight or underweight, your caregiver will advise you regarding your weight.  Your caregiver will ask and answer questions for you.  Blood work, cervical cultures, other necessary tests, and a Pap test are done during your prenatal exams. These tests are done to check on your health and the probable health of your baby. Tests are strongly recommended and done for HIV with your permission. This is the virus that causes AIDS. These tests are done because medicines can be given to help prevent your baby from being born with this infection should you have been infected without knowing it. Blood work is also used to find out your blood type, previous infections, and follow your blood levels (hemoglobin).  Low hemoglobin (anemia) is common during pregnancy. Iron and vitamins are given to help prevent this. Later in the pregnancy, blood tests for diabetes will be done along with any other tests if any problems develop.  You may need other tests to make sure you and the baby are doing well. CHANGES DURING THE FIRST TRIMESTER  Your body goes through many changes during pregnancy. They vary from person to person. Talk to your caregiver about changes you notice and are concerned about. Changes can include:  Your menstrual period stops.  The egg and sperm carry the genes that determine what you look like. Genes from you   and your partner are forming a baby. The female genes determine whether the baby is a boy or a girl.  Your body increases in girth and you may feel bloated.  Feeling sick to your stomach (nauseous) and throwing up (vomiting). If the vomiting is uncontrollable, call your caregiver.  Your breasts will begin to enlarge and become tender.  Your nipples may stick out more and become darker.  The need to urinate more. Painful urination may mean you have a bladder infection.  Tiring easily.  Loss of appetite.  Cravings for certain kinds of  food.  At first, you may gain or lose a couple of pounds.  You may have changes in your emotions from day to day (excited to be pregnant or concerned something may go wrong with the pregnancy and baby).  You may have more vivid and strange dreams. HOME CARE INSTRUCTIONS   It is very important to avoid all smoking, alcohol and non-prescribed drugs during your pregnancy. These affect the formation and growth of the baby. Avoid chemicals while pregnant to ensure the delivery of a healthy infant.  Start your prenatal visits by the 12th week of pregnancy. They are usually scheduled monthly at first, then more often in the last 2 months before delivery. Keep your caregiver's appointments. Follow your caregiver's instructions regarding medicine use, blood and lab tests, exercise, and diet.  During pregnancy, you are providing food for you and your baby. Eat regular, well-balanced meals. Choose foods such as meat, fish, milk and other low fat dairy products, vegetables, fruits, and whole-grain breads and cereals. Your caregiver will tell you of the ideal weight gain.  You can help morning sickness by keeping soda crackers at the bedside. Eat a couple before arising in the morning. You may want to use the crackers without salt on them.  Eating 4 to 5 small meals rather than 3 large meals a day also may help the nausea and vomiting.  Drinking liquids between meals instead of during meals also seems to help nausea and vomiting.  A physical sexual relationship may be continued throughout pregnancy if there are no other problems. Problems may be early (premature) leaking of amniotic fluid from the membranes, vaginal bleeding, or belly (abdominal) pain.  Exercise regularly if there are no restrictions. Check with your caregiver or physical therapist if you are unsure of the safety of some of your exercises. Greater weight gain will occur in the last 2 trimesters of pregnancy. Exercising will  help:  Control your weight.  Keep you in shape.  Prepare you for labor and delivery.  Help you lose your pregnancy weight after you deliver your baby.  Wear a good support or jogging bra for breast tenderness during pregnancy. This may help if worn during sleep too.  Ask when prenatal classes are available. Begin classes when they are offered.  Do not use hot tubs, steam rooms, or saunas.  Wear your seat belt when driving. This protects you and your baby if you are in an accident.  Avoid raw meat, uncooked cheese, cat litter boxes, and soil used by cats throughout the pregnancy. These carry germs that can cause birth defects in the baby.  The first trimester is a good time to visit your dentist for your dental health. Getting your teeth cleaned is okay. Use a softer toothbrush and brush gently during pregnancy.  Ask for help if you have financial, counseling, or nutritional needs during pregnancy. Your caregiver will be able to offer counseling for   these needs as well as refer you for other special needs.  Do not take any medicines or herbs unless told by your caregiver.  Inform your caregiver if there is any mental or physical domestic violence.  Make a list of emergency phone numbers of family, friends, hospital, and police and fire departments.  Write down your questions. Take them to your prenatal visit.  Do not douche.  Do not cross your legs.  If you have to stand for long periods of time, rotate you feet or take small steps in a circle.  You may have more vaginal secretions that may require a sanitary pad. Do not use tampons or scented sanitary pads. MEDICINES AND DRUG USE IN PREGNANCY  Take prenatal vitamins as directed. The vitamin should contain 1 milligram of folic acid. Keep all vitamins out of reach of children. Only a couple vitamins or tablets containing iron may be fatal to a baby or young child when ingested.  Avoid use of all medicines, including herbs,  over-the-counter medicines, not prescribed or suggested by your caregiver. Only take over-the-counter or prescription medicines for pain, discomfort, or fever as directed by your caregiver. Do not use aspirin, ibuprofen, or naproxen unless directed by your caregiver.  Let your caregiver also know about herbs you may be using.  Alcohol is related to a number of birth defects. This includes fetal alcohol syndrome. All alcohol, in any form, should be avoided completely. Smoking will cause low birth rate and premature babies.  Street or illegal drugs are very harmful to the baby. They are absolutely forbidden. A baby born to an addicted mother will be addicted at birth. The baby will go through the same withdrawal an adult does.  Let your caregiver know about any medicines that you have to take and for what reason you take them. SEEK MEDICAL CARE IF:  You have any concerns or worries during your pregnancy. It is better to call with your questions if you feel they cannot wait, rather than worry about them. SEEK IMMEDIATE MEDICAL CARE IF:   An unexplained oral temperature above 102 F (38.9 C) develops, or as your caregiver suggests.  You have leaking of fluid from the vagina (birth canal). If leaking membranes are suspected, take your temperature and inform your caregiver of this when you call.  There is vaginal spotting or bleeding. Notify your caregiver of the amount and how many pads are used.  You develop a bad smelling vaginal discharge with a change in the color.  You continue to feel sick to your stomach (nauseated) and have no relief from remedies suggested. You vomit blood or coffee ground-like materials.  You lose more than 2 pounds of weight in 1 week.  You gain more than 2 pounds of weight in 1 week and you notice swelling of your face, hands, feet, or legs.  You gain 5 pounds or more in 1 week (even if you do not have swelling of your hands, face, legs, or feet).  You get  exposed to German measles and have never had them.  You are exposed to fifth disease or chickenpox.  You develop belly (abdominal) pain. Round ligament discomfort is a common non-cancerous (benign) cause of abdominal pain in pregnancy. Your caregiver still must evaluate this.  You develop headache, fever, diarrhea, pain with urination, or shortness of breath.  You fall or are in a car accident or have any kind of trauma.  There is mental or physical violence in your home. Document   Released: 05/27/2001 Document Revised: 02/25/2012 Document Reviewed: 11/28/2008 [employer] Patient Information 2014 [employer].  

## 2013-10-19 ENCOUNTER — Telehealth: Payer: Self-pay | Admitting: Women's Health

## 2013-10-19 ENCOUNTER — Encounter: Payer: Self-pay | Admitting: Women's Health

## 2013-10-19 DIAGNOSIS — O99019 Anemia complicating pregnancy, unspecified trimester: Secondary | ICD-10-CM | POA: Insufficient documentation

## 2013-10-19 LAB — CYSTIC FIBROSIS DIAGNOSTIC STUDY

## 2013-10-19 LAB — RUBELLA SCREEN: Rubella: 7.51 Index — ABNORMAL HIGH (ref <0.90)

## 2013-10-19 LAB — URINALYSIS
BILIRUBIN URINE: NEGATIVE
Glucose, UA: NEGATIVE mg/dL
HGB URINE DIPSTICK: NEGATIVE
KETONES UR: NEGATIVE mg/dL
Leukocytes, UA: NEGATIVE
Nitrite: NEGATIVE
PH: 6 (ref 5.0–8.0)
Protein, ur: NEGATIVE mg/dL
Urobilinogen, UA: 1 mg/dL (ref 0.0–1.0)

## 2013-10-19 LAB — DRUG SCREEN, URINE, NO CONFIRMATION
AMPHETAMINE SCRN UR: NEGATIVE
BENZODIAZEPINES.: NEGATIVE
Barbiturate Quant, Ur: NEGATIVE
COCAINE METABOLITES: NEGATIVE
Creatinine,U: 429.5 mg/dL
METHADONE: NEGATIVE
Marijuana Metabolite: NEGATIVE
Opiate Screen, Urine: NEGATIVE
PHENCYCLIDINE (PCP): NEGATIVE
Propoxyphene: NEGATIVE

## 2013-10-19 LAB — OXYCODONE SCREEN, UA, RFLX CONFIRM: OXYCODONE SCRN UR: NEGATIVE ng/mL

## 2013-10-19 LAB — GC/CHLAMYDIA PROBE AMP
CT Probe RNA: NEGATIVE
GC PROBE AMP APTIMA: NEGATIVE

## 2013-10-19 LAB — VARICELLA ZOSTER ANTIBODY, IGG: Varicella IgG: >4000 Index — ABNORMAL HIGH (ref <135.00)

## 2013-10-19 LAB — ABO AND RH: RH TYPE: POSITIVE

## 2013-10-19 LAB — HIV ANTIBODY (ROUTINE TESTING W REFLEX): HIV: NONREACTIVE

## 2013-10-19 LAB — HEPATITIS B SURFACE ANTIGEN: Hepatitis B Surface Ag: NEGATIVE

## 2013-10-19 LAB — ANTIBODY SCREEN: Antibody Screen: NEGATIVE

## 2013-10-19 LAB — RPR

## 2013-10-19 MED ORDER — FUSION PLUS PO CAPS: 1.0000 | ORAL_CAPSULE | ORAL | Status: DC

## 2013-10-19 NOTE — Telephone Encounter (Signed)
Notified pt of anemia, rx fusion plus sent to pharmacy.  Cheral MarkerKimberly R. Nemesio Castrillon, CNM, WHNP-BC 10/19/2013 2:02 PM

## 2013-10-20 LAB — URINE CULTURE

## 2013-10-24 ENCOUNTER — Telehealth: Payer: Self-pay | Admitting: Women's Health

## 2013-10-24 ENCOUNTER — Encounter: Payer: Self-pay | Admitting: Women's Health

## 2013-10-24 ENCOUNTER — Telehealth: Payer: Self-pay | Admitting: *Deleted

## 2013-10-24 DIAGNOSIS — R8271 Bacteriuria: Secondary | ICD-10-CM | POA: Insufficient documentation

## 2013-10-24 DIAGNOSIS — O9989 Other specified diseases and conditions complicating pregnancy, childbirth and the puerperium: Secondary | ICD-10-CM

## 2013-10-24 MED ORDER — CEPHALEXIN 500 MG PO CAPS: 500.0000 mg | ORAL_CAPSULE | Freq: Four times a day (QID) | ORAL | Status: DC

## 2013-10-24 NOTE — Telephone Encounter (Signed)
LM for pt to return call. Need to notify her of +UTI and rx at her pharmacy.  Cheral MarkerKimberly R. Chisa Kushner, CNM, [facility] 10/24/2013 2:02 PM

## 2013-10-24 NOTE — Telephone Encounter (Signed)
Notified of +urine cx, rx at pharmacy.  Cheral MarkerKimberly R. Donnell Beauchamp, CNM, [facility] 10/24/2013 5:37 PM

## 2013-10-25 ENCOUNTER — Other Ambulatory Visit: Payer: Self-pay | Admitting: Women's Health

## 2013-10-25 ENCOUNTER — Ambulatory Visit (INDEPENDENT_AMBULATORY_CARE_PROVIDER_SITE_OTHER): Payer: Self-pay | Admitting: Obstetrics & Gynecology

## 2013-10-25 ENCOUNTER — Encounter: Payer: Medicaid Other | Admitting: Adult Health

## 2013-10-25 ENCOUNTER — Ambulatory Visit (INDEPENDENT_AMBULATORY_CARE_PROVIDER_SITE_OTHER): Payer: Medicaid Other

## 2013-10-25 ENCOUNTER — Other Ambulatory Visit: Payer: Medicaid Other

## 2013-10-25 VITALS — BP 128/68 | Wt 272.0 lb

## 2013-10-25 DIAGNOSIS — Z1389 Encounter for screening for other disorder: Secondary | ICD-10-CM

## 2013-10-25 DIAGNOSIS — Z36 Encounter for antenatal screening of mother: Secondary | ICD-10-CM

## 2013-10-25 DIAGNOSIS — Z331 Pregnant state, incidental: Secondary | ICD-10-CM

## 2013-10-25 DIAGNOSIS — Z3480 Encounter for supervision of other normal pregnancy, unspecified trimester: Secondary | ICD-10-CM

## 2013-10-25 LAB — POCT URINALYSIS DIPSTICK
Blood, UA: NEGATIVE
GLUCOSE UA: NEGATIVE
Ketones, UA: NEGATIVE
LEUKOCYTES UA: NEGATIVE
NITRITE UA: NEGATIVE

## 2013-10-25 NOTE — Progress Notes (Signed)
U/S(12+1wks)-single IUP with +FCA noted, FHR- 162 bpm, cx appears closed, bilateral adnexa appears WNL, anterior Gr 0 placenta, CRL c/w dates, NB present, NT-1.6770mm

## 2013-10-25 NOTE — Progress Notes (Signed)
Sonogram is reviewed and report done, all normal 1st IT doen Follow up 4 weeks No bleeding No complaints

## 2013-11-03 LAB — MATERNAL SCREEN, INTEGRATED #1

## 2013-11-23 ENCOUNTER — Encounter: Payer: Self-pay | Admitting: Advanced Practice Midwife

## 2013-11-23 ENCOUNTER — Ambulatory Visit (INDEPENDENT_AMBULATORY_CARE_PROVIDER_SITE_OTHER): Payer: Self-pay | Admitting: Advanced Practice Midwife

## 2013-11-23 VITALS — BP 130/50 | Wt 274.0 lb

## 2013-11-23 DIAGNOSIS — Z1389 Encounter for screening for other disorder: Secondary | ICD-10-CM

## 2013-11-23 DIAGNOSIS — O9989 Other specified diseases and conditions complicating pregnancy, childbirth and the puerperium: Secondary | ICD-10-CM

## 2013-11-23 DIAGNOSIS — Z3480 Encounter for supervision of other normal pregnancy, unspecified trimester: Secondary | ICD-10-CM

## 2013-11-23 DIAGNOSIS — Z331 Pregnant state, incidental: Secondary | ICD-10-CM

## 2013-11-23 DIAGNOSIS — R8271 Bacteriuria: Secondary | ICD-10-CM

## 2013-11-23 DIAGNOSIS — O99891 Other specified diseases and conditions complicating pregnancy: Secondary | ICD-10-CM

## 2013-11-23 LAB — POCT URINALYSIS DIPSTICK
Glucose, UA: NEGATIVE
Ketones, UA: NEGATIVE
Nitrite, UA: POSITIVE
PROTEIN UA: NEGATIVE

## 2013-11-23 MED ORDER — DOXYLAMINE-PYRIDOXINE 10-10 MG PO TBEC: DELAYED_RELEASE_TABLET | ORAL | Status: DC

## 2013-11-23 MED ORDER — SULFAMETHOXAZOLE-TMP DS 800-160 MG PO TABS: 1.0000 | ORAL_TABLET | Freq: Two times a day (BID) | ORAL | Status: DC

## 2013-11-23 MED ORDER — ONDANSETRON 4 MG PO TBDP: 4.0000 mg | ORAL_TABLET | Freq: Four times a day (QID) | ORAL | Status: DC | PRN

## 2013-11-23 NOTE — Progress Notes (Signed)
G2P1001 [redacted]w[redacted]d Estimated Date of Delivery: 05/08/14  Blood pressure 130/50, weight 274 lb (124.286 kg), last menstrual period 08/01/2013, not currently breastfeeding.   BP weight and urine results all reviewed and noted. PT still has Keflex in bottle from rx in May.  + nitrites today. 2nd IT today.  C/O nausea since first trimester, requests meds now. FHR 150  Patient reports good fetal movement, denies any bleeding and no rupture of membranes symptoms or regular contractions. Patient is without complaints. All questions were answered.  Plan:  Continued routine obstetrical care  Meds ordered this encounter  Medications  . sulfamethoxazole-trimethoprim (BACTRIM DS) 800-160 MG per tablet    Sig: Take 1 tablet by mouth 2 (two) times daily.    Dispense:  10 tablet    Refill:  0    Order Specific Question:  Supervising Provider    Answer:  Despina Hidden, LUTHER H [[id number]]  . Doxylamine-Pyridoxine 10-10 MG TBEC    Sig: 2 PO qhs; may take 1po in am and 1po in afternoon prn nausea    Dispense:  120 tablet    Refill:  3    Order Specific Question:  Supervising Provider    Answer:  Despina Hidden, LUTHER H [[id number]]  . ondansetron (ZOFRAN ODT) 4 MG disintegrating tablet    Sig: Take 1 tablet (4 mg total) by mouth every 6 (six) hours as needed for nausea.    Dispense:  20 tablet    Refill:  0    Order Specific Question:  Supervising Provider    Answer:  Lazaro Arms [[id number]]    Prior Authorization given for diclegis.  No samples, so zofran sent as well. Follow up in 3 weeks for OB appointment, anatomy scan

## 2013-11-30 LAB — MATERNAL SCREEN, INTEGRATED #2
AFP MOM MAT SCREEN: 0.61
AFP, Serum: 17.3 ng/mL
Age risk Down Syndrome: 1:1100 {titer}
CALCULATED GESTATIONAL AGE MAT SCREEN: 16.6
CROWN RUMP LENGTH MAT SCREEN 2: 61.2 mm
Estriol Mom: 1.16
Estriol, Free: 0.9 ng/mL
HCG, SERUM MAT SCREEN: 65.3 [IU]/mL
Inhibin A Dimeric: 108 pg/mL
Inhibin A MoM: 0.82
MSS Down Syndrome: 1:1800 {titer}
NT MOM MAT SCREEN: 1.21
NUCHAL TRANSLUCENCY MAT SCREEN 2: 1.7 mm
Number of fetuses: 1
PAPP-A MOM MAT SCREEN: 0.92
PAPP-A: 625 ng/mL
Rish for ONTD: <1:5000 {titer}
hCG MoM: 2.34

## 2013-12-14 ENCOUNTER — Ambulatory Visit (INDEPENDENT_AMBULATORY_CARE_PROVIDER_SITE_OTHER): Payer: Self-pay | Admitting: Women's Health

## 2013-12-14 ENCOUNTER — Ambulatory Visit (INDEPENDENT_AMBULATORY_CARE_PROVIDER_SITE_OTHER): Payer: Medicaid Other

## 2013-12-14 ENCOUNTER — Other Ambulatory Visit: Payer: Self-pay | Admitting: Advanced Practice Midwife

## 2013-12-14 ENCOUNTER — Encounter: Payer: Self-pay | Admitting: Women's Health

## 2013-12-14 VITALS — BP 120/72 | Wt 277.0 lb

## 2013-12-14 DIAGNOSIS — O2341 Unspecified infection of urinary tract in pregnancy, first trimester: Secondary | ICD-10-CM

## 2013-12-14 DIAGNOSIS — Z331 Pregnant state, incidental: Secondary | ICD-10-CM

## 2013-12-14 DIAGNOSIS — Z3480 Encounter for supervision of other normal pregnancy, unspecified trimester: Secondary | ICD-10-CM

## 2013-12-14 DIAGNOSIS — Z1389 Encounter for screening for other disorder: Secondary | ICD-10-CM

## 2013-12-14 DIAGNOSIS — O2390 Unspecified genitourinary tract infection in pregnancy, unspecified trimester: Secondary | ICD-10-CM

## 2013-12-14 DIAGNOSIS — Z3482 Encounter for supervision of other normal pregnancy, second trimester: Secondary | ICD-10-CM

## 2013-12-14 DIAGNOSIS — O09299 Supervision of pregnancy with other poor reproductive or obstetric history, unspecified trimester: Secondary | ICD-10-CM

## 2013-12-14 LAB — POCT URINALYSIS DIPSTICK
Blood, UA: NEGATIVE
GLUCOSE UA: NEGATIVE
Ketones, UA: NEGATIVE
Leukocytes, UA: NEGATIVE
NITRITE UA: NEGATIVE
Protein, UA: NEGATIVE

## 2013-12-14 NOTE — Progress Notes (Signed)
Pt denies any problems or concerns at this time.  

## 2013-12-14 NOTE — Progress Notes (Signed)
U/S(19+2wks)-single active fetus, meas c/w dates, fluid wnl, anterior Gr 0 placenta, cx appears closed (4.4cm), bilateral adnexa appears WNL, female fetus, no obvious abnl noted, FHR- 149bpm

## 2013-12-14 NOTE — Progress Notes (Signed)
Low-risk OB appointment G2P1001 [id number] Estimated Date of Delivery: 05/08/14 BP 120/72  Wt 277 lb (125.646 kg)  LMP 08/01/2013  BP, weight, and urine reviewed.  Refer to obstetrical flow sheet for FH & FHR.  Reports good fm.  Denies regular uc's, lof, vb, or uti s/s. No complaints. Reviewed today's anatomy u/s, ptl s/s, fm. Plan:  Continue routine obstetrical care  F/U in 4wks for OB appointment

## 2013-12-14 NOTE — Patient Instructions (Signed)
Second Trimester of Pregnancy The second trimester is from week 13 through week 28, months 4 through 6. The second trimester is often a time when you feel your best. Your body has also adjusted to being pregnant, and you begin to feel better physically. Usually, morning sickness has lessened or quit completely, you may have more energy, and you may have an increase in appetite. The second trimester is also a time when the fetus is growing rapidly. At the end of the sixth month, the fetus is about 9 inches long and weighs about 1 pounds. You will likely begin to feel the baby move (quickening) between 18 and 20 weeks of the pregnancy. BODY CHANGES Your body goes through many changes during pregnancy. The changes vary from woman to woman.   Your weight will continue to increase. You will notice your lower abdomen bulging out.  You may begin to get stretch marks on your hips, abdomen, and breasts.  You may develop headaches that can be relieved by medicines approved by your health care provider.  You may urinate more often because the fetus is pressing on your bladder.  You may develop or continue to have heartburn as a result of your pregnancy.  You may develop constipation because certain hormones are causing the muscles that push waste through your intestines to slow down.  You may develop hemorrhoids or swollen, bulging veins (varicose veins).  You may have back pain because of the weight gain and pregnancy hormones relaxing your joints between the bones in your pelvis and as a result of a shift in weight and the muscles that support your balance.  Your breasts will continue to grow and be tender.  Your gums may bleed and may be sensitive to brushing and flossing.  Dark spots or blotches (chloasma, mask of pregnancy) may develop on your face. This will likely fade after the baby is born.  A dark line from your belly button to the pubic area (linea nigra) may appear. This will likely fade  after the baby is born.  You may have changes in your hair. These can include thickening of your hair, rapid growth, and changes in texture. Some women also have hair loss during or after pregnancy, or hair that feels dry or thin. Your hair will most likely return to normal after your baby is born. WHAT TO EXPECT AT YOUR PRENATAL VISITS During a routine prenatal visit:  You will be weighed to make sure you and the fetus are growing normally.  Your blood pressure will be taken.  Your abdomen will be measured to track your baby's growth.  The fetal heartbeat will be listened to.  Any test results from the previous visit will be discussed. Your health care provider may ask you:  How you are feeling.  If you are feeling the baby move.  If you have had any abnormal symptoms, such as leaking fluid, bleeding, severe headaches, or abdominal cramping.  If you have any questions. Other tests that may be performed during your second trimester include:  Blood tests that check for:  Low iron levels (anemia).  Gestational diabetes (between 24 and 28 weeks).  Rh antibodies.  Urine tests to check for infections, diabetes, or protein in the urine.  An ultrasound to confirm the proper growth and development of the baby.  An amniocentesis to check for possible genetic problems.  Fetal screens for spina bifida and Down syndrome. HOME CARE INSTRUCTIONS   Avoid all smoking, herbs, alcohol, and unprescribed   drugs. These chemicals affect the formation and growth of the baby.  Follow your health care provider's instructions regarding medicine use. There are medicines that are either safe or unsafe to take during pregnancy.  Exercise only as directed by your health care provider. Experiencing uterine cramps is a good sign to stop exercising.  Continue to eat regular, healthy meals.  Wear a good support bra for breast tenderness.  Do not use hot tubs, steam rooms, or saunas.  Wear your  seat belt at all times when driving.  Avoid raw meat, uncooked cheese, cat litter boxes, and soil used by cats. These carry germs that can cause birth defects in the baby.  Take your prenatal vitamins.  Try taking a stool softener (if your health care provider approves) if you develop constipation. Eat more high-fiber foods, such as fresh vegetables or fruit and whole grains. Drink plenty of fluids to keep your urine clear or pale yellow.  Take warm sitz baths to soothe any pain or discomfort caused by hemorrhoids. Use hemorrhoid cream if your health care provider approves.  If you develop varicose veins, wear support hose. Elevate your feet for 15 minutes, 3-4 times a day. Limit salt in your diet.  Avoid heavy lifting, wear low heel shoes, and practice good posture.  Rest with your legs elevated if you have leg cramps or low back pain.  Visit your dentist if you have not gone yet during your pregnancy. Use a soft toothbrush to brush your teeth and be gentle when you floss.  A sexual relationship may be continued unless your health care provider directs you otherwise.  Continue to go to all your prenatal visits as directed by your health care provider. SEEK MEDICAL CARE IF:   You have dizziness.  You have mild pelvic cramps, pelvic pressure, or nagging pain in the abdominal area.  You have persistent nausea, vomiting, or diarrhea.  You have a bad smelling vaginal discharge.  You have pain with urination. SEEK IMMEDIATE MEDICAL CARE IF:   You have a fever.  You are leaking fluid from your vagina.  You have spotting or bleeding from your vagina.  You have severe abdominal cramping or pain.  You have rapid weight gain or loss.  You have shortness of breath with chest pain.  You notice sudden or extreme swelling of your face, hands, ankles, feet, or legs.  You have not felt your baby move in over an hour.  You have severe headaches that do not go away with  medicine.  You have vision changes. Document Released: 05/27/2001 Document Revised: 06/07/2013 Document Reviewed: 08/03/2012 [employer] Patient Information 2015 [employer]. This information is not intended to replace advice given to you by your health care provider. Make sure you discuss any questions you have with your health care provider.  

## 2014-01-11 ENCOUNTER — Encounter: Payer: Medicaid Other | Admitting: Women's Health

## 2014-01-18 ENCOUNTER — Ambulatory Visit (INDEPENDENT_AMBULATORY_CARE_PROVIDER_SITE_OTHER): Payer: Self-pay | Admitting: Women's Health

## 2014-01-18 ENCOUNTER — Encounter: Payer: Self-pay | Admitting: Women's Health

## 2014-01-18 VITALS — BP 138/56 | Wt 280.4 lb

## 2014-01-18 DIAGNOSIS — Z3480 Encounter for supervision of other normal pregnancy, unspecified trimester: Secondary | ICD-10-CM

## 2014-01-18 DIAGNOSIS — Z331 Pregnant state, incidental: Secondary | ICD-10-CM

## 2014-01-18 DIAGNOSIS — Z3482 Encounter for supervision of other normal pregnancy, second trimester: Secondary | ICD-10-CM

## 2014-01-18 DIAGNOSIS — Z1389 Encounter for screening for other disorder: Secondary | ICD-10-CM

## 2014-01-18 LAB — POCT URINALYSIS DIPSTICK
Blood, UA: NEGATIVE
Glucose, UA: NEGATIVE
Ketones, UA: NEGATIVE
LEUKOCYTES UA: NEGATIVE
Nitrite, UA: NEGATIVE

## 2014-01-18 MED ORDER — PRENATAL MULTIVITAMIN CH: 1.0000 | ORAL_TABLET | Freq: Every day | ORAL | Status: DC

## 2014-01-18 NOTE — Patient Instructions (Signed)
You will have your sugar test next visit.  Please do not eat or drink anything after midnight the night before you come, not even water.  You will be here for at least two hours.     Call the office ([phone]) or go to [facility] if:  You begin to have strong, frequent contractions  Your water breaks.  Sometimes it is a big gush of fluid, sometimes it is just a trickle that keeps getting your panties wet or running down your legs  You have vaginal bleeding.  It is normal to have a small amount of spotting if your cervix was checked.  You don't feel your baby moving like normal.  If you don't, get you something to eat and drink and lay down and focus on feeling your baby move.    Second Trimester of Pregnancy The second trimester is from week 13 through week 28, months 4 through 6. The second trimester is often a time when you feel your best. Your body has also adjusted to being pregnant, and you begin to feel better physically. Usually, morning sickness has lessened or quit completely, you may have more energy, and you may have an increase in appetite. The second trimester is also a time when the fetus is growing rapidly. At the end of the sixth month, the fetus is about 9 inches long and weighs about 1 pounds. You will likely begin to feel the baby move (quickening) between 18 and 20 weeks of the pregnancy. BODY CHANGES Your body goes through many changes during pregnancy. The changes vary from woman to woman.   Your weight will continue to increase. You will notice your lower abdomen bulging out.  You may begin to get stretch marks on your hips, abdomen, and breasts.  You may develop headaches that can be relieved by medicines approved by your health care provider.  You may urinate more often because the fetus is pressing on your bladder.  You may develop or continue to have heartburn as a result of your pregnancy.  You may develop constipation because certain hormones are causing  the muscles that push waste through your intestines to slow down.  You may develop hemorrhoids or swollen, bulging veins (varicose veins).  You may have back pain because of the weight gain and pregnancy hormones relaxing your joints between the bones in your pelvis and as a result of a shift in weight and the muscles that support your balance.  Your breasts will continue to grow and be tender.  Your gums may bleed and may be sensitive to brushing and flossing.  Dark spots or blotches (chloasma, mask of pregnancy) may develop on your face. This will likely fade after the baby is born.  A dark line from your belly button to the pubic area (linea nigra) may appear. This will likely fade after the baby is born.  You may have changes in your hair. These can include thickening of your hair, rapid growth, and changes in texture. Some women also have hair loss during or after pregnancy, or hair that feels dry or thin. Your hair will most likely return to normal after your baby is born. WHAT TO EXPECT AT YOUR PRENATAL VISITS During a routine prenatal visit:  You will be weighed to make sure you and the fetus are growing normally.  Your blood pressure will be taken.  Your abdomen will be measured to track your baby's growth.  The fetal heartbeat will be listened to.  Any   test results from the previous visit will be discussed. Your health care provider may ask you:  How you are feeling.  If you are feeling the baby move.  If you have had any abnormal symptoms, such as leaking fluid, bleeding, severe headaches, or abdominal cramping.  If you have any questions. Other tests that may be performed during your second trimester include:  Blood tests that check for:  Low iron levels (anemia).  Gestational diabetes (between 24 and 28 weeks).  Rh antibodies.  Urine tests to check for infections, diabetes, or protein in the urine.  An ultrasound to confirm the proper growth and  development of the baby.  An amniocentesis to check for possible genetic problems.  Fetal screens for spina bifida and Down syndrome. HOME CARE INSTRUCTIONS   Avoid all smoking, herbs, alcohol, and unprescribed drugs. These chemicals affect the formation and growth of the baby.  Follow your health care provider's instructions regarding medicine use. There are medicines that are either safe or unsafe to take during pregnancy.  Exercise only as directed by your health care provider. Experiencing uterine cramps is a good sign to stop exercising.  Continue to eat regular, healthy meals.  Wear a good support bra for breast tenderness.  Do not use hot tubs, steam rooms, or saunas.  Wear your seat belt at all times when driving.  Avoid raw meat, uncooked cheese, cat litter boxes, and soil used by cats. These carry germs that can cause birth defects in the baby.  Take your prenatal vitamins.  Try taking a stool softener (if your health care provider approves) if you develop constipation. Eat more high-fiber foods, such as fresh vegetables or fruit and whole grains. Drink plenty of fluids to keep your urine clear or pale yellow.  Take warm sitz baths to soothe any pain or discomfort caused by hemorrhoids. Use hemorrhoid cream if your health care provider approves.  If you develop varicose veins, wear support hose. Elevate your feet for 15 minutes, 3-4 times a day. Limit salt in your diet.  Avoid heavy lifting, wear low heel shoes, and practice good posture.  Rest with your legs elevated if you have leg cramps or low back pain.  Visit your dentist if you have not gone yet during your pregnancy. Use a soft toothbrush to brush your teeth and be gentle when you floss.  A sexual relationship may be continued unless your health care provider directs you otherwise.  Continue to go to all your prenatal visits as directed by your health care provider. SEEK MEDICAL CARE IF:   You have  dizziness.  You have mild pelvic cramps, pelvic pressure, or nagging pain in the abdominal area.  You have persistent nausea, vomiting, or diarrhea.  You have a bad smelling vaginal discharge.  You have pain with urination. SEEK IMMEDIATE MEDICAL CARE IF:   You have a fever.  You are leaking fluid from your vagina.  You have spotting or bleeding from your vagina.  You have severe abdominal cramping or pain.  You have rapid weight gain or loss.  You have shortness of breath with chest pain.  You notice sudden or extreme swelling of your face, hands, ankles, feet, or legs.  You have not felt your baby move in over an hour.  You have severe headaches that do not go away with medicine.  You have vision changes. Document Released: 05/27/2001 Document Revised: 06/07/2013 Document Reviewed: 08/03/2012 ExitCare Patient Information 2015 [employer]. This information is not intended   to replace advice given to you by your health care provider. Make sure you discuss any questions you have with your health care provider.  

## 2014-01-18 NOTE — Progress Notes (Signed)
Low-risk OB appointment G2P1001 3759w2d Estimated Date of Delivery: 05/08/14 BP 138/56  Wt 280 lb 6.4 oz (127.189 kg)  LMP 08/01/2013  BP, weight, and urine reviewed.  Refer to obstetrical flow sheet for FH & FHR.  Reports good fm.  Denies regular uc's, lof, vb, or uti s/s. No complaints. Needs more pnv.  Reviewed ptl s/s, fm. Plan:  Continue routine obstetrical care  F/U in 4wks for OB appointment and pn2

## 2014-01-30 ENCOUNTER — Telehealth: Payer: Self-pay | Admitting: Women's Health

## 2014-01-30 ENCOUNTER — Ambulatory Visit (INDEPENDENT_AMBULATORY_CARE_PROVIDER_SITE_OTHER): Payer: Self-pay | Admitting: Obstetrics & Gynecology

## 2014-01-30 ENCOUNTER — Encounter: Payer: Self-pay | Admitting: Obstetrics & Gynecology

## 2014-01-30 VITALS — BP 120/60 | Wt 284.0 lb

## 2014-01-30 DIAGNOSIS — Z3480 Encounter for supervision of other normal pregnancy, unspecified trimester: Secondary | ICD-10-CM

## 2014-01-30 DIAGNOSIS — Z1389 Encounter for screening for other disorder: Secondary | ICD-10-CM

## 2014-01-30 DIAGNOSIS — Z3482 Encounter for supervision of other normal pregnancy, second trimester: Secondary | ICD-10-CM

## 2014-01-30 DIAGNOSIS — Z331 Pregnant state, incidental: Secondary | ICD-10-CM

## 2014-01-30 LAB — POCT URINALYSIS DIPSTICK
Glucose, UA: NEGATIVE
KETONES UA: NEGATIVE
Leukocytes, UA: NEGATIVE
Nitrite, UA: NEGATIVE
RBC UA: NEGATIVE

## 2014-01-30 MED ORDER — TERCONAZOLE 0.4 % VA CREA: 1.0000 | TOPICAL_CREAM | Freq: Every day | VAGINAL | Status: DC

## 2014-01-30 NOTE — Telephone Encounter (Signed)
Pt states "thinks her water is leaking," +FM, vaginal spotting.  Pt informed to be here today at 3:15 for evaluation. Pt verbalized understanding.

## 2014-01-30 NOTE — Addendum Note (Signed)
Addended by: Richardson ChiquitoRAVIS, ASHLEY M on: 01/30/2014 04:35 PM   Modules accepted: Orders

## 2014-01-30 NOTE — Progress Notes (Signed)
+   yeast  terazol 7 cream  G2P1001 317w0d Estimated Date of Delivery: 05/08/14  Blood pressure 120/60, weight 284 lb (128.822 kg), last menstrual period 08/01/2013, not currently breastfeeding.   BP weight and urine results all reviewed and noted.  Please refer to the obstetrical flow sheet for the fundal height and fetal heart rate documentation:  Patient reports good fetal movement, denies any bleeding and no rupture of membranes symptoms or regular contractions. Patient is without complaints. All questions were answered.  Plan:  Continued routine obstetrical care,   Follow up in 2 weeks for OB appointment, PN2, keep  No rom

## 2014-02-16 ENCOUNTER — Other Ambulatory Visit: Payer: Medicaid Other

## 2014-02-16 ENCOUNTER — Encounter: Payer: Medicaid Other | Admitting: Advanced Practice Midwife

## 2014-02-22 ENCOUNTER — Other Ambulatory Visit: Payer: Medicaid Other

## 2014-02-22 ENCOUNTER — Ambulatory Visit (INDEPENDENT_AMBULATORY_CARE_PROVIDER_SITE_OTHER): Payer: Medicaid Other | Admitting: Advanced Practice Midwife

## 2014-02-22 VITALS — BP 110/60 | Wt 287.0 lb

## 2014-02-22 DIAGNOSIS — Z3480 Encounter for supervision of other normal pregnancy, unspecified trimester: Secondary | ICD-10-CM

## 2014-02-22 DIAGNOSIS — Z331 Pregnant state, incidental: Secondary | ICD-10-CM

## 2014-02-22 DIAGNOSIS — Z3483 Encounter for supervision of other normal pregnancy, third trimester: Secondary | ICD-10-CM

## 2014-02-22 DIAGNOSIS — Z131 Encounter for screening for diabetes mellitus: Secondary | ICD-10-CM

## 2014-02-22 DIAGNOSIS — Z0184 Encounter for antibody response examination: Secondary | ICD-10-CM

## 2014-02-22 DIAGNOSIS — Z1159 Encounter for screening for other viral diseases: Secondary | ICD-10-CM

## 2014-02-22 NOTE — Progress Notes (Signed)
G2P1001 [redacted]w[redacted]d Estimated Date of Delivery: 05/08/14  Blood pressure 110/60, weight 287 lb (130.182 kg), last menstrual period 08/01/2013, not currently breastfeeding.   BP weight and urine results all reviewed and noted.  Please refer to the obstetrical flow sheet for the fundal height and fetal heart rate documentation:  Patient reports good fetal movement, denies any bleeding and no rupture of membranes symptoms or regular contractions. Patient is without complaints. All questions were answered.  Plan:  Continued routine obstetrical care, doing PN2 today  Follow up in 3 weeks for OB appointment,

## 2014-02-23 LAB — HIV ANTIBODY (ROUTINE TESTING W REFLEX): HIV 1&2 Ab, 4th Generation: NONREACTIVE

## 2014-02-23 LAB — ANTIBODY SCREEN: Antibody Screen: NEGATIVE

## 2014-02-23 LAB — GLUCOSE TOLERANCE, 2 HOURS W/ 1HR
GLUCOSE, 2 HOUR: 66 mg/dL — AB (ref 70–139)
Glucose, 1 hour: 78 mg/dL (ref 70–170)
Glucose, Fasting: 73 mg/dL (ref 70–99)

## 2014-02-23 LAB — CBC
HEMATOCRIT: 33 % — AB (ref 36.0–46.0)
Hemoglobin: 10.6 g/dL — ABNORMAL LOW (ref 12.0–15.0)
MCH: 23.5 pg — AB (ref 26.0–34.0)
MCHC: 32.1 g/dL (ref 30.0–36.0)
MCV: 73 fL — ABNORMAL LOW (ref 78.0–100.0)
PLATELETS: 251 10*3/uL (ref 150–400)
RBC: 4.52 MIL/uL (ref 3.87–5.11)
RDW: 18.2 % — ABNORMAL HIGH (ref 11.5–15.5)
WBC: 11.1 10*3/uL — ABNORMAL HIGH (ref 4.0–10.5)

## 2014-02-23 LAB — RPR

## 2014-02-24 LAB — HSV 2 ANTIBODY, IGG: HSV 2 Glycoprotein G Ab, IgG: <0.1 IV

## 2014-02-28 ENCOUNTER — Encounter: Payer: Self-pay | Admitting: Advanced Practice Midwife

## 2014-03-15 ENCOUNTER — Encounter: Payer: Medicaid Other | Admitting: Adult Health

## 2014-03-20 ENCOUNTER — Ambulatory Visit (INDEPENDENT_AMBULATORY_CARE_PROVIDER_SITE_OTHER): Payer: Medicaid Other | Admitting: Obstetrics & Gynecology

## 2014-03-20 ENCOUNTER — Encounter: Payer: Self-pay | Admitting: Obstetrics & Gynecology

## 2014-03-20 VITALS — BP 130/60 | Wt 289.0 lb

## 2014-03-20 DIAGNOSIS — Z3483 Encounter for supervision of other normal pregnancy, third trimester: Secondary | ICD-10-CM

## 2014-03-20 DIAGNOSIS — Z331 Pregnant state, incidental: Secondary | ICD-10-CM

## 2014-03-20 DIAGNOSIS — Z1389 Encounter for screening for other disorder: Secondary | ICD-10-CM

## 2014-03-20 NOTE — Progress Notes (Signed)
G2P1001 4981w0d Estimated Date of Delivery: 05/08/14  Blood pressure 130/60, weight 289 lb (131.09 kg), last menstrual period 08/01/2013, not currently breastfeeding.   BP weight and urine results all reviewed and noted.  Please refer to the obstetrical flow sheet for the fundal height and fetal heart rate documentation:  Patient reports good fetal movement, denies any bleeding and no rupture of membranes symptoms or regular contractions. Patient is without complaints. All questions were answered.  Plan:  Continued routine obstetrical care,   Follow up in 2 weeks for OB appointment, routine

## 2014-03-31 ENCOUNTER — Other Ambulatory Visit: Payer: Self-pay

## 2014-04-03 ENCOUNTER — Encounter: Payer: Self-pay | Admitting: Women's Health

## 2014-04-03 ENCOUNTER — Ambulatory Visit (INDEPENDENT_AMBULATORY_CARE_PROVIDER_SITE_OTHER): Payer: Medicaid Other | Admitting: Women's Health

## 2014-04-03 VITALS — BP 138/48 | Wt 289.0 lb

## 2014-04-03 DIAGNOSIS — Z1389 Encounter for screening for other disorder: Secondary | ICD-10-CM

## 2014-04-03 DIAGNOSIS — Z3483 Encounter for supervision of other normal pregnancy, third trimester: Secondary | ICD-10-CM

## 2014-04-03 DIAGNOSIS — Z331 Pregnant state, incidental: Secondary | ICD-10-CM

## 2014-04-03 LAB — POCT URINALYSIS DIPSTICK
Blood, UA: NEGATIVE
Glucose, UA: NEGATIVE
Ketones, UA: NEGATIVE
LEUKOCYTES UA: NEGATIVE
NITRITE UA: NEGATIVE
PROTEIN UA: NEGATIVE

## 2014-04-03 NOTE — Patient Instructions (Signed)
Call the office ([phone]) or go to [facility] if:  You begin to have strong, frequent contractions  Your water breaks.  Sometimes it is a big gush of fluid, sometimes it is just a trickle that keeps getting your panties wet or running down your legs  You have vaginal bleeding.  It is normal to have a small amount of spotting if your cervix was checked.   You don't feel your baby moving like normal.  If you don't, get you something to eat and drink and lay down and focus on feeling your baby move.  You should feel at least 10 movements in 2 hours.  If you don't, you should call the office or go to [facility].    Preterm Labor Information Preterm labor is when labor starts at less than 37 weeks of pregnancy. The normal length of a pregnancy is 39 to 41 weeks. CAUSES Often, there is no identifiable underlying cause as to why a woman goes into preterm labor. One of the most common known causes of preterm labor is infection. Infections of the uterus, cervix, vagina, amniotic sac, bladder, kidney, or even the lungs (pneumonia) can cause labor to start. Other suspected causes of preterm labor include:   Urogenital infections, such as yeast infections and bacterial vaginosis.   Uterine abnormalities (uterine shape, uterine septum, fibroids, or bleeding from the placenta).   A cervix that has been operated on (it may fail to stay closed).   Malformations in the fetus.   Multiple gestations (twins, triplets, and so on).   Breakage of the amniotic sac.  RISK FACTORS  Having a previous history of preterm labor.   Having premature rupture of membranes (PROM).   Having a placenta that covers the opening of the cervix (placenta previa).   Having a placenta that separates from the uterus (placental abruption).   Having a cervix that is too weak to hold the fetus in the uterus (incompetent cervix).   Having too much fluid in the amniotic sac (polyhydramnios).   Taking  illegal drugs or smoking while pregnant.   Not gaining enough weight while pregnant.   Being younger than 18 and older than 24 years old.   Having a low socioeconomic status.   Being African American. SYMPTOMS Signs and symptoms of preterm labor include:   Menstrual-like cramps, abdominal pain, or back pain.  Uterine contractions that are regular, as frequent as six in an hour, regardless of their intensity (may be mild or painful).  Contractions that start on the top of the uterus and spread down to the lower abdomen and back.   A sense of increased pelvic pressure.   A watery or bloody mucus discharge that comes from the vagina.  TREATMENT Depending on the length of the pregnancy and other circumstances, your health care provider may suggest bed rest. If necessary, there are medicines that can be given to stop contractions and to mature the fetal lungs. If labor happens before 34 weeks of pregnancy, a prolonged hospital stay may be recommended. Treatment depends on the condition of both you and the fetus.  WHAT SHOULD YOU DO IF YOU THINK YOU ARE IN PRETERM LABOR? Call your health care provider right away. You will need to go to the hospital to get checked immediately. HOW CAN YOU PREVENT PRETERM LABOR IN FUTURE PREGNANCIES? You should:   Stop smoking if you smoke.  Maintain healthy weight gain and avoid chemicals and drugs that are not necessary.  Be watchful for   any type of infection.  Inform your health care provider if you have a known history of preterm labor. Document Released: 08/23/2003 Document Revised: 02/02/2013 Document Reviewed: 07/05/2012 ExitCare Patient Information 2015 [employer]. This information is not intended to replace advice given to you by your health care provider. Make sure you discuss any questions you have with your health care provider.  

## 2014-04-03 NOTE — Progress Notes (Signed)
Low-risk OB appointment G2P1001 5258w0d Estimated Date of Delivery: 05/08/14 BP 138/48  Wt 289 lb (131.09 kg)  LMP 08/01/2013  BP, weight, and urine reviewed.  Refer to obstetrical flow sheet for FH & FHR.  Reports good fm.  Denies regular uc's, lof, vb, or uti s/s. No complaints. Reviewed ptl s/s, fkc. Plan:  Continue routine obstetrical care  F/U in 1wk for OB appointment

## 2014-04-10 ENCOUNTER — Encounter: Payer: Medicaid Other | Admitting: Women's Health

## 2014-04-17 ENCOUNTER — Encounter: Payer: Self-pay | Admitting: Women's Health

## 2014-04-18 ENCOUNTER — Encounter: Payer: Self-pay | Admitting: Obstetrics & Gynecology

## 2014-04-18 ENCOUNTER — Ambulatory Visit (INDEPENDENT_AMBULATORY_CARE_PROVIDER_SITE_OTHER): Payer: Medicaid Other | Admitting: Obstetrics & Gynecology

## 2014-04-18 VITALS — BP 120/60 | Wt 293.0 lb

## 2014-04-18 DIAGNOSIS — Z331 Pregnant state, incidental: Secondary | ICD-10-CM

## 2014-04-18 DIAGNOSIS — O1210 Gestational proteinuria, unspecified trimester: Secondary | ICD-10-CM

## 2014-04-18 DIAGNOSIS — Z3483 Encounter for supervision of other normal pregnancy, third trimester: Secondary | ICD-10-CM

## 2014-04-18 DIAGNOSIS — Z118 Encounter for screening for other infectious and parasitic diseases: Secondary | ICD-10-CM

## 2014-04-18 DIAGNOSIS — Z1159 Encounter for screening for other viral diseases: Secondary | ICD-10-CM

## 2014-04-18 DIAGNOSIS — Z1389 Encounter for screening for other disorder: Secondary | ICD-10-CM

## 2014-04-18 DIAGNOSIS — Z3685 Encounter for antenatal screening for Streptococcus B: Secondary | ICD-10-CM

## 2014-04-18 LAB — POCT URINALYSIS DIPSTICK
GLUCOSE UA: NEGATIVE
Ketones, UA: NEGATIVE
Leukocytes, UA: NEGATIVE
NITRITE UA: NEGATIVE
RBC UA: NEGATIVE

## 2014-04-18 LAB — OB RESULTS CONSOLE GC/CHLAMYDIA
CHLAMYDIA, DNA PROBE: NEGATIVE
Gonorrhea: NEGATIVE

## 2014-04-18 NOTE — Progress Notes (Signed)
G2P1001 8354w1d Estimated Date of Delivery: 05/08/14  Blood pressure 120/60, weight 293 lb (132.904 kg), last menstrual period 08/01/2013, not currently breastfeeding.   BP weight and urine results all reviewed and noted.  Please refer to the obstetrical flow sheet for the fundal height and fetal heart rate documentation:  Patient reports good fetal movement, denies any bleeding and no rupture of membranes symptoms or regular contractions. Patient is without complaints. All questions were answered.  Plan:  Continued routine obstetrical care,   Follow up in 1 weeks for OB appointment, routine

## 2014-04-18 NOTE — Addendum Note (Signed)
Addended by: Criss AlvinePULLIAM, Kandyce Dieguez G on: 04/18/2014 04:01 PM   Modules accepted: Orders

## 2014-04-18 NOTE — Addendum Note (Signed)
Addended by: Criss AlvinePULLIAM, Tabetha Haraway G on: 04/18/2014 04:03 PM   Modules accepted: Orders

## 2014-04-19 LAB — GC/CHLAMYDIA PROBE AMP
CT Probe RNA: NEGATIVE
GC Probe RNA: NEGATIVE

## 2014-04-19 LAB — STREP B DNA PROBE: GBSP: DETECTED

## 2014-04-20 ENCOUNTER — Encounter: Payer: Self-pay | Admitting: Obstetrics & Gynecology

## 2014-04-20 ENCOUNTER — Ambulatory Visit (INDEPENDENT_AMBULATORY_CARE_PROVIDER_SITE_OTHER): Payer: Medicaid Other | Admitting: Obstetrics & Gynecology

## 2014-04-20 ENCOUNTER — Telehealth: Payer: Self-pay | Admitting: Obstetrics & Gynecology

## 2014-04-20 VITALS — BP 122/50 | Wt 293.0 lb

## 2014-04-20 DIAGNOSIS — Z3483 Encounter for supervision of other normal pregnancy, third trimester: Secondary | ICD-10-CM

## 2014-04-20 DIAGNOSIS — Z1389 Encounter for screening for other disorder: Secondary | ICD-10-CM

## 2014-04-20 DIAGNOSIS — Z331 Pregnant state, incidental: Secondary | ICD-10-CM

## 2014-04-20 NOTE — Telephone Encounter (Signed)
Pt c/o a watery discharge, + FM, no contractions. Pt informed to come in now for evaluation.

## 2014-04-20 NOTE — Progress Notes (Signed)
SSE No pool no fern neg nitrazine  Cervical mucuous in the vault had sex last night  Keep scheduled

## 2014-04-23 ENCOUNTER — Encounter (HOSPITAL_COMMUNITY): Payer: Self-pay

## 2014-04-23 ENCOUNTER — Inpatient Hospital Stay (HOSPITAL_COMMUNITY)
Admission: AD | Admit: 2014-04-23 | Discharge: 2014-04-25 | DRG: 775 | Disposition: A | Discharge status: Home / Self Care | Payer: Medicaid Other | Source: Ambulatory Visit | Attending: Family Medicine | Admitting: Family Medicine

## 2014-04-23 ENCOUNTER — Inpatient Hospital Stay (HOSPITAL_COMMUNITY): Payer: Medicaid Other | Admitting: Anesthesiology

## 2014-04-23 DIAGNOSIS — Z3A37 37 weeks gestation of pregnancy: Secondary | ICD-10-CM | POA: Diagnosis present

## 2014-04-23 DIAGNOSIS — O99891 Other specified diseases and conditions complicating pregnancy: Secondary | ICD-10-CM

## 2014-04-23 DIAGNOSIS — Z833 Family history of diabetes mellitus: Secondary | ICD-10-CM

## 2014-04-23 DIAGNOSIS — R8271 Bacteriuria: Secondary | ICD-10-CM

## 2014-04-23 DIAGNOSIS — Z8249 Family history of ischemic heart disease and other diseases of the circulatory system: Secondary | ICD-10-CM

## 2014-04-23 DIAGNOSIS — O99824 Streptococcus B carrier state complicating childbirth: Principal | ICD-10-CM | POA: Diagnosis present

## 2014-04-23 DIAGNOSIS — O9989 Other specified diseases and conditions complicating pregnancy, childbirth and the puerperium: Secondary | ICD-10-CM | POA: Diagnosis present

## 2014-04-23 DIAGNOSIS — Z823 Family history of stroke: Secondary | ICD-10-CM

## 2014-04-23 DIAGNOSIS — Z3483 Encounter for supervision of other normal pregnancy, third trimester: Secondary | ICD-10-CM

## 2014-04-23 DIAGNOSIS — O99013 Anemia complicating pregnancy, third trimester: Secondary | ICD-10-CM

## 2014-04-23 LAB — CBC
HCT: 32.3 % — ABNORMAL LOW (ref 36.0–46.0)
Hemoglobin: 10.1 g/dL — ABNORMAL LOW (ref 12.0–15.0)
MCH: 23.5 pg — AB (ref 26.0–34.0)
MCHC: 31.3 g/dL (ref 30.0–36.0)
MCV: 75.3 fL — ABNORMAL LOW (ref 78.0–100.0)
PLATELETS: 233 10*3/uL (ref 150–400)
RBC: 4.29 MIL/uL (ref 3.87–5.11)
RDW: 17.6 % — ABNORMAL HIGH (ref 11.5–15.5)
WBC: 10.9 10*3/uL — ABNORMAL HIGH (ref 4.0–10.5)

## 2014-04-23 LAB — TYPE AND SCREEN
ABO/RH(D): A POS
Antibody Screen: NEGATIVE

## 2014-04-23 LAB — POCT FERN TEST: POCT Fern Test: POSITIVE

## 2014-04-23 LAB — RAPID HIV SCREEN (WH-MAU): Rapid HIV Screen: NONREACTIVE

## 2014-04-23 MED ORDER — FENTANYL CITRATE 0.05 MG/ML IJ SOLN
100.0000 ug | INTRAMUSCULAR | Status: DC | PRN
  Administered 2014-04-23 (×3): 100 ug via INTRAVENOUS
  Filled 2014-04-23 (×2): qty 2

## 2014-04-23 MED ORDER — OXYCODONE-ACETAMINOPHEN 5-325 MG PO TABS
2.0000 | ORAL_TABLET | ORAL | Status: DC | PRN
  Administered 2014-04-25: 2 via ORAL

## 2014-04-23 MED ORDER — ONDANSETRON HCL 4 MG/2ML IJ SOLN
4.0000 mg | INTRAMUSCULAR | Status: DC | PRN
Start: 2014-04-23 — End: 2014-04-25

## 2014-04-23 MED ORDER — OXYTOCIN 40 UNITS IN LACTATED RINGERS INFUSION - SIMPLE MED: 62.5000 mL/h | INTRAVENOUS | Status: DC

## 2014-04-23 MED ORDER — LIDOCAINE HCL (PF) 1 % IJ SOLN
INTRAMUSCULAR | Status: DC | PRN
  Administered 2014-04-23 (×2): 5 mL

## 2014-04-23 MED ORDER — LANOLIN HYDROUS EX OINT: TOPICAL_OINTMENT | CUTANEOUS | Status: DC | PRN

## 2014-04-23 MED ORDER — EPHEDRINE 5 MG/ML INJ
10.0000 mg | INTRAVENOUS | Status: DC | PRN
  Filled 2014-04-23: qty 2

## 2014-04-23 MED ORDER — MISOPROSTOL 25 MCG QUARTER TABLET
50.0000 ug | ORAL_TABLET | Freq: Once | ORAL | Status: AC
  Administered 2014-04-23: 50 ug via ORAL
  Filled 2014-04-23: qty 0.5

## 2014-04-23 MED ORDER — SENNOSIDES-DOCUSATE SODIUM 8.6-50 MG PO TABS
2.0000 | ORAL_TABLET | ORAL | Status: DC
  Administered 2014-04-25: 2 via ORAL
  Filled 2014-04-23: qty 2

## 2014-04-23 MED ORDER — BENZOCAINE-MENTHOL 20-0.5 % EX AERO: 1.0000 "application " | INHALATION_SPRAY | CUTANEOUS | Status: DC | PRN

## 2014-04-23 MED ORDER — ONDANSETRON HCL 4 MG/2ML IJ SOLN: 4.0000 mg | Freq: Four times a day (QID) | INTRAMUSCULAR | Status: DC | PRN

## 2014-04-23 MED ORDER — ONDANSETRON HCL 4 MG PO TABS: 4.0000 mg | ORAL_TABLET | ORAL | Status: DC | PRN

## 2014-04-23 MED ORDER — SODIUM CHLORIDE 0.9 % IV SOLN: 250.0000 mL | INTRAVENOUS | Status: DC | PRN

## 2014-04-23 MED ORDER — OXYCODONE-ACETAMINOPHEN 5-325 MG PO TABS
2.0000 | ORAL_TABLET | ORAL | Status: DC | PRN
  Filled 2014-04-23: qty 2

## 2014-04-23 MED ORDER — SODIUM CHLORIDE 0.9 % IJ SOLN: 3.0000 mL | Freq: Two times a day (BID) | INTRAMUSCULAR | Status: DC

## 2014-04-23 MED ORDER — PHENYLEPHRINE 40 MCG/ML (10ML) SYRINGE FOR IV PUSH (FOR BLOOD PRESSURE SUPPORT)
80.0000 ug | PREFILLED_SYRINGE | INTRAVENOUS | Status: DC | PRN
  Filled 2014-04-23: qty 2
  Filled 2014-04-23: qty 10

## 2014-04-23 MED ORDER — FENTANYL 2.5 MCG/ML BUPIVACAINE 1/10 % EPIDURAL INFUSION (WH - ANES)
14.0000 mL/h | INTRAMUSCULAR | Status: DC | PRN
  Administered 2014-04-23: 14 mL/h via EPIDURAL
  Filled 2014-04-23: qty 125

## 2014-04-23 MED ORDER — FENTANYL CITRATE 0.05 MG/ML IJ SOLN
INTRAMUSCULAR | Status: AC
  Filled 2014-04-23: qty 2

## 2014-04-23 MED ORDER — DIPHENHYDRAMINE HCL 25 MG PO CAPS: 25.0000 mg | ORAL_CAPSULE | Freq: Four times a day (QID) | ORAL | Status: DC | PRN

## 2014-04-23 MED ORDER — LIDOCAINE HCL (PF) 1 % IJ SOLN: 30.0000 mL | INTRAMUSCULAR | Status: DC | PRN

## 2014-04-23 MED ORDER — OXYTOCIN 40 UNITS IN LACTATED RINGERS INFUSION - SIMPLE MED: 62.5000 mL/h | INTRAVENOUS | Status: DC | PRN

## 2014-04-23 MED ORDER — PENICILLIN G POTASSIUM 5000000 UNITS IJ SOLR
2.5000 10*6.[IU] | INTRAMUSCULAR | Status: DC
  Administered 2014-04-23 (×2): 2.5 10*6.[IU] via INTRAVENOUS
  Filled 2014-04-23 (×5): qty 2.5

## 2014-04-23 MED ORDER — ZOLPIDEM TARTRATE 5 MG PO TABS: 5.0000 mg | ORAL_TABLET | Freq: Every evening | ORAL | Status: DC | PRN

## 2014-04-23 MED ORDER — IBUPROFEN 600 MG PO TABS
600.0000 mg | ORAL_TABLET | Freq: Four times a day (QID) | ORAL | Status: DC
  Administered 2014-04-24 – 2014-04-25 (×7): 600 mg via ORAL
  Filled 2014-04-23 (×7): qty 1

## 2014-04-23 MED ORDER — DEXTROSE 5 % IV SOLN
5.0000 10*6.[IU] | Freq: Once | INTRAVENOUS | Status: AC
  Administered 2014-04-23: 5 10*6.[IU] via INTRAVENOUS
  Filled 2014-04-23: qty 5

## 2014-04-23 MED ORDER — SIMETHICONE 80 MG PO CHEW: 80.0000 mg | CHEWABLE_TABLET | ORAL | Status: DC | PRN

## 2014-04-23 MED ORDER — ACETAMINOPHEN 325 MG PO TABS: 650.0000 mg | ORAL_TABLET | ORAL | Status: DC | PRN

## 2014-04-23 MED ORDER — PRENATAL MULTIVITAMIN CH
1.0000 | ORAL_TABLET | Freq: Every day | ORAL | Status: DC
  Administered 2014-04-24 – 2014-04-25 (×2): 1 via ORAL
  Filled 2014-04-23 (×2): qty 1

## 2014-04-23 MED ORDER — OXYTOCIN 40 UNITS IN LACTATED RINGERS INFUSION - SIMPLE MED
1.0000 m[IU]/min | INTRAVENOUS | Status: DC
  Administered 2014-04-23: 2 m[IU]/min via INTRAVENOUS
  Filled 2014-04-23: qty 1000

## 2014-04-23 MED ORDER — WITCH HAZEL-GLYCERIN EX PADS: 1.0000 "application " | MEDICATED_PAD | CUTANEOUS | Status: DC | PRN

## 2014-04-23 MED ORDER — TERBUTALINE SULFATE 1 MG/ML IJ SOLN
0.2500 mg | Freq: Once | INTRAMUSCULAR | Status: DC | PRN
Start: 2014-04-23 — End: 2014-04-23

## 2014-04-23 MED ORDER — LACTATED RINGERS IV SOLN: 500.0000 mL | INTRAVENOUS | Status: DC | PRN

## 2014-04-23 MED ORDER — DIPHENHYDRAMINE HCL 50 MG/ML IJ SOLN: 12.5000 mg | INTRAMUSCULAR | Status: DC | PRN

## 2014-04-23 MED ORDER — DIBUCAINE 1 % RE OINT: 1.0000 "application " | TOPICAL_OINTMENT | RECTAL | Status: DC | PRN

## 2014-04-23 MED ORDER — PHENYLEPHRINE 40 MCG/ML (10ML) SYRINGE FOR IV PUSH (FOR BLOOD PRESSURE SUPPORT)
80.0000 ug | PREFILLED_SYRINGE | INTRAVENOUS | Status: DC | PRN
  Filled 2014-04-23: qty 2

## 2014-04-23 MED ORDER — OXYCODONE-ACETAMINOPHEN 5-325 MG PO TABS
1.0000 | ORAL_TABLET | ORAL | Status: DC | PRN
  Filled 2014-04-23: qty 1

## 2014-04-23 MED ORDER — OXYTOCIN BOLUS FROM INFUSION
500.0000 mL | INTRAVENOUS | Status: DC
  Administered 2014-04-23: 500 mL via INTRAVENOUS

## 2014-04-23 MED ORDER — FLEET ENEMA 7-19 GM/118ML RE ENEM: 1.0000 | ENEMA | RECTAL | Status: DC | PRN

## 2014-04-23 MED ORDER — LACTATED RINGERS IV SOLN: INTRAVENOUS | Status: DC

## 2014-04-23 MED ORDER — SODIUM CHLORIDE 0.9 % IJ SOLN: 3.0000 mL | INTRAMUSCULAR | Status: DC | PRN

## 2014-04-23 MED ORDER — TETANUS-DIPHTH-ACELL PERTUSSIS 5-2.5-18.5 LF-MCG/0.5 IM SUSP
0.5000 mL | Freq: Once | INTRAMUSCULAR | Status: AC
  Administered 2014-04-25: 0.5 mL via INTRAMUSCULAR
  Filled 2014-04-23: qty 0.5

## 2014-04-23 MED ORDER — CITRIC ACID-SODIUM CITRATE 334-500 MG/5ML PO SOLN: 30.0000 mL | ORAL | Status: DC | PRN

## 2014-04-23 MED ORDER — LACTATED RINGERS IV SOLN: 500.0000 mL | Freq: Once | INTRAVENOUS | Status: DC

## 2014-04-23 MED ORDER — OXYCODONE-ACETAMINOPHEN 5-325 MG PO TABS
1.0000 | ORAL_TABLET | ORAL | Status: DC | PRN
  Administered 2014-04-24: 1 via ORAL

## 2014-04-23 NOTE — MAU Note (Signed)
Leaking clear fluid since 340 this morning. Denies vaginal bleeding or contractions. Positive fetal movement.

## 2014-04-23 NOTE — Plan of Care (Signed)
Problem: Phase I Progression Outcomes Goal: OOB as tolerated unless otherwise ordered Outcome: Completed/Met Date Met:  04/23/14     

## 2014-04-23 NOTE — H&P (Signed)
LABOR ADMISSION HISTORY AND PHYSICAL  Angela Mcclure is a 23 y.o. female G2P1001 with IUP at 5164w6d by US presenting w SROM this am at 0330. She reports +FMs, no VB, no blurry vision, headaches or peripheral edema, and RUQ pain. She desires an epidural for labor pain control. She plans on breast and bottle feeding. Undecided re bc. Dating: By US --->  Estimated Date of Delivery: 05/08/14  Sono:    @[redacted]w[redacted]d , CWD, normal anatomy, %EFW not noted  Prenatal History/Complications:  Past Medical History: Past Medical History  Diagnosis Date  . Asthma     Past Surgical History: Past Surgical History  Procedure Laterality Date  . No past surgeries      Obstetrical History: OB History    Gravida Para Term Preterm AB TAB SAB Ectopic Multiple Living   2 1 1       1        Social History: History   Social History  . Marital Status: Single    Spouse Name: N/A    Number of Children: N/A  . Years of Education: N/A   Social History Main Topics  . Smoking status: Never Smoker   . Smokeless tobacco: Never Used  . Alcohol Use: No  . Drug Use: No  . Sexual Activity: Yes    Birth Control/ Protection: None   Other Topics Concern  . None   Social History Narrative    Family History: Family History  Problem Relation Age of Onset  . Diabetes Other   . Hypertension Other   . Stroke Other     Allergies: No Known Allergies  Prescriptions prior to admission  Medication Sig Dispense Refill Last Dose  . Prenatal Vit-Fe Fumarate-FA (PRENATAL MULTIVITAMIN) TABS tablet Take 1 tablet by mouth daily. 30 tablet 11 04/22/2014 at Unknown time     Review of Systems   All systems reviewed and negative except as stated in HPI  Last menstrual period 08/01/2013, not currently breastfeeding. General appearance: alert, cooperative and no distress Lungs: clear to auscultation bilaterally Heart: regular rate and rhythm Abdomen: soft, non-tender; bowel sounds normal Extremities: Homans  sign is negative, no sign of DVT DTR's Normal Presentation: cephalic Fetal monitoringBaseline: 140 bpm, Variability: Good {> 6 bpm), Accelerations: Reactive and Decelerations: Absent Uterine activityNone     Prenatal labs: ABO, Rh: A/POS/-- (05/05 1055) Antibody: NEG (09/09 0934) Rubella:   RPR: NON REAC (09/09 0934)  HBsAg: NEGATIVE (05/05 1055)  HIV: NONREACTIVE (09/09 0934)  GBS: Detected (11/03 1659)  1 hr Glucola Negative Genetic screening  Normal Anatomy US Normal   Prenatal Transfer Tool  Maternal Diabetes: No Genetic Screening: Normal Maternal Ultrasounds/Referrals: Normal Fetal Ultrasounds or other Referrals:  None Maternal Substance Abuse:  No Significant Maternal Medications:  None Significant Maternal Lab Results: Lab values include: Group B Strep positive     Results for orders placed or performed during the hospital encounter of 04/23/14 (from the past 24 hour(s))  Fern Test   Collection Time: 04/23/14  5:29 AM  Result Value Ref Range   POCT Fern Test Positive = ruptured amniotic membanes     Patient Active Problem List   Diagnosis Date Noted  . Asymptomatic bacteriuria in pregnancy in first trimester 10/24/2013  . Anemia of mother in pregnancy, antepartum 10/19/2013  . Supervision of other normal pregnancy 10/18/2013  . Obesity 10/18/2013  . History of gestational hypertension 10/18/2013  . Asthma 08/18/2012    Assessment: Angela Mcclure is a 23 y.o. G2P1001 at  7019w6d here for SROM. 23 y.o. @GP1 @ at 5219w6d by [place] Cat I strip.  #Labor: --Admit to birthing suites --Iv access --Thin clear diet  #Pain:  --May have epidural if desired  #FWB:  --Category I strip --Continuous fetal monitoring  #ID:   --GBS positive --PCN 2.5 million units q 4h   Aldona BarKoch, Kari L 04/23/2014, 5:33 AM  0800: I spoke with and examined patient and agree with resident/PA/SNM's note and plan of care.  No uc's, not in labor. SROM x 5hours. SVE: 1/50/-3, vtx. Wants to  eat. Will do po cytotec x 1 dose, plan for foley bulb. May eat light diet until active labor/pitocin.  Cheral MarkerKimberly R. Isaac Dubie, CNM, [facility]NP-BC 04/23/2014 8:48 AM

## 2014-04-23 NOTE — Plan of Care (Signed)
Problem: Phase I Progression Outcomes Goal: Tolerating diet Outcome: Completed/Met Date Met:  04/23/14

## 2014-04-23 NOTE — Anesthesia Procedure Notes (Signed)
Epidural Patient location during procedure: OB Start time: 04/23/2014 2:45 PM  Staffing Anesthesiologist: Brayton CavesJACKSON, Jennifier Smitherman Performed by: anesthesiologist   Preanesthetic Checklist Completed: patient identified, site marked, surgical consent, pre-op evaluation, timeout performed, IV checked, risks and benefits discussed and monitors and equipment checked  Epidural Patient position: sitting Prep: site prepped and draped and DuraPrep Patient monitoring: continuous pulse ox and blood pressure Approach: midline Location: L3-L4 Injection technique: LOR air  Needle:  Needle type: Tuohy  Needle gauge: 17 G Needle length: 9 cm and 9 Needle insertion depth: 8 cm Catheter type: closed end flexible Catheter size: 19 Gauge Catheter at skin depth: 14 cm Test dose: negative  Assessment Events: blood not aspirated, injection not painful, no injection resistance, negative IV test and no paresthesia  Additional Notes Patient identified.  Risk benefits discussed including failed block, incomplete pain control, headache, nerve damage, paralysis, blood pressure changes, nausea, vomiting, reactions to medication both toxic or allergic, and postpartum back pain.  Patient expressed understanding and wished to proceed.  All questions were answered.  Sterile technique used throughout procedure and epidural site dressed with sterile barrier dressing. No paresthesia or other complications noted.The patient did not experience any signs of intravascular injection such as tinnitus or metallic taste in mouth nor signs of intrathecal spread such as rapid motor block. Please see nursing notes for vital signs.

## 2014-04-23 NOTE — Plan of Care (Signed)
Problem: Phase I Progression Outcomes Goal: FHR checked 5 minutes after meds (ROM) Rupture of Membranes Outcome: Not Applicable Date Met:  14/10/30

## 2014-04-23 NOTE — Plan of Care (Signed)
Problem: Phase II Progression Outcomes Goal: Fetal monitoring per orders Outcome: Completed/Met Date Met:  04/23/14

## 2014-04-23 NOTE — Progress Notes (Signed)
Angela Mcclure is a 23 y.o. G2P1001 at 7365w6d by ultrasound admitted for rupture of membranes  Subjective:   Objective: BP 129/53 mmHg  Pulse 73  Temp(Src) 97.8 F (36.6 C) (Oral)  Resp 18  Ht 5\' 5"  (1.651 m)  Wt 284 lb (128.822 kg)  BMI 47.26 kg/m2  SpO2 100%  LMP 08/01/2013  Breastfeeding? No      FHT:  FHR: 150's bpm, variability: moderate,  accelerations:  Present,  decelerations:  Absent UC:   regular, every 6 minutes SVE:   Dilation: 1 Effacement (%): 50 Station: -3 Exam by:: Angela Mcclure  Labs: Lab Results  Component Value Date   WBC 10.9* 04/23/2014   HGB 10.1* 04/23/2014   HCT 32.3* 04/23/2014   MCV 75.3* 04/23/2014   PLT 233 04/23/2014    Assessment / Plan: Induction of labor due to SROM and no labor,  progressing well on pitocin  Labor: on cytotec will progress to pitocin Preeclampsia:  no signs or symptoms of toxicity and intake and ouput balanced Fetal Wellbeing:  Category I Pain Control:  Fentanyl I/D:  n/a Anticipated MOD:  NSVD  Angela Mcclure 04/23/2014, 11:16 AM

## 2014-04-23 NOTE — Progress Notes (Signed)
Epidural catheter removed, tip intact.

## 2014-04-23 NOTE — Plan of Care (Signed)
Problem: Consults Goal: Automotive engineer Patient Education (See Patient Education module for education specifics.) Outcome: Completed/Met Date Met:  04/23/14 Goal: Birthing Suites Patient Information Press F2 to bring up selections list Outcome: Completed/Met Date Met:  04/23/14  Pt 37-[redacted] weeks EGA Goal: Prenatal labs/testing reviewed upon admission Outcome: Completed/Met Date Met:  04/23/14 Goal: Orientation to unit: Plan of Care Outcome: Completed/Met Date Met:  04/23/14 Goal: Orientation to unit: Room Outcome: Completed/Met Date Met:  04/23/14 Goal: Orientation to unit: [place] (smoking, visitation, chaplain services, helpline) Outcome: Completed/Met Date Met:  04/23/14 Goal: Orientation to unit: Other (Specify with a note) Outcome: Not Applicable Date Met:  58/52/77  Problem: Phase I Progression Outcomes Goal: Assess per MD/Nurse,Routine-VS,FHR,UC,Head to Toe assess Outcome: Completed/Met Date Met:  04/23/14 Goal: Obtain and review prenatal records Outcome: Completed/Met Date Met:  04/23/14 Goal: OOB as tolerated unless otherwise ordered Outcome: Progressing Goal: Medications/IV Fluids N/A Outcome: Progressing

## 2014-04-23 NOTE — Plan of Care (Signed)
Problem: Phase I Progression Outcomes Goal: Pain controlled with appropriate interventions Outcome: Completed/Met Date Met:  04/23/14 Goal: Voiding adequately Outcome: Completed/Met Date Met:  04/23/14 Goal: Foley catheter patent Outcome: Not Applicable Date Met:  15/94/70 Goal: OOB as tolerated unless otherwise ordered Outcome: Completed/Met Date Met:  04/23/14 Goal: IS, TCDB as ordered Outcome: Not Applicable Date Met:  76/15/18 Goal: VS, stable, temp < 100.4 degrees F Outcome: Not Applicable Date Met:  34/37/35 Goal: Initial discharge plan identified Outcome: Completed/Met Date Met:  04/23/14

## 2014-04-23 NOTE — Anesthesia Preprocedure Evaluation (Signed)
Anesthesia Evaluation  Patient identified by MRN, date of birth, ID band Patient awake    Reviewed: Allergy & Precautions, H&P , Patient's Chart, lab work & pertinent test results  Airway Mallampati: III  TM Distance: >3 FB Neck ROM: full    Dental   Pulmonary asthma ,  breath sounds clear to auscultation        Cardiovascular Rhythm:regular Rate:Normal     Neuro/Psych    GI/Hepatic   Endo/Other  Morbid obesity  Renal/GU      Musculoskeletal   Abdominal   Peds  Hematology  (+) anemia ,   Anesthesia Other Findings   Reproductive/Obstetrics (+) Pregnancy                             Anesthesia Physical Anesthesia Plan  ASA: III  Anesthesia Plan: Epidural   Post-op Pain Management:    Induction:   Airway Management Planned:   Additional Equipment:   Intra-op Plan:   Post-operative Plan:   Informed Consent: I have reviewed the patients History and Physical, chart, labs and discussed the procedure including the risks, benefits and alternatives for the proposed anesthesia with the patient or authorized representative who has indicated his/her understanding and acceptance.     Plan Discussed with:   Anesthesia Plan Comments:         Anesthesia Quick Evaluation

## 2014-04-24 LAB — RPR

## 2014-04-24 NOTE — Plan of Care (Signed)
Problem: Consults Goal: Skin Care Protocol Initiated - if Braden Score 18 or less If consults are not indicated, leave blank or document N/A  Outcome: Not Applicable Date Met:  20/94/70 Goal: Nutrition Consult-if indicated Outcome: Not Applicable Date Met:  96/28/36 Goal: Diabetes Guidelines if Diabetic/Glucose > 140 If diabetic or lab glucose is > 140 mg/dl - Initiate Diabetes/Hyperglycemia Guidelines & Document Interventions  Outcome: Not Applicable Date Met:  62/94/76  Problem: Phase I Progression Outcomes Goal: Other Phase I Outcomes/Goals Outcome: Not Applicable Date Met:  54/65/03  Problem: Phase II Progression Outcomes Goal: Incision intact & without signs/symptoms of infection Outcome: Not Applicable Date Met:  54/65/68 Goal: Rh isoimmunization per orders Outcome: Not Applicable Date Met:  12/75/17 Mother is A+ Goal: Tolerating diet Outcome: Completed/Met Date Met:  04/24/14  Problem: Discharge Progression Outcomes Goal: Remove staples per MD order Outcome: Not Applicable Date Met:  00/17/49 Patient does not have staples             Goal: MMR given as ordered Outcome: Not Applicable Date Met:  44/96/75 Patient is Rubella immune; MMR not needed

## 2014-04-24 NOTE — Progress Notes (Signed)
Ur chart review completed.  

## 2014-04-24 NOTE — Anesthesia Postprocedure Evaluation (Signed)
Anesthesia Post Note  Patient: Angela Mcclure  Procedure(s) Performed: * No procedures listed *  Anesthesia type: Epidural  Patient location: Mother/Baby  Post pain: Pain level controlled  Post assessment: Post-op Vital signs reviewed  Last Vitals:  Filed Vitals:   04/23/14 2130  BP: 120/89  Pulse: 89  Temp: 36.9 C  Resp: 19    Post vital signs: Reviewed  Level of consciousness:alert  Complications: No apparent anesthesia complications

## 2014-04-24 NOTE — Progress Notes (Signed)
Post Partum Day 1 Subjective: no complaints, up ad lib, voiding, tolerating PO and + flatus  Objective: Blood pressure 120/89, pulse 89, temperature 98.5 F (36.9 C), temperature source Oral, resp. rate 19, height 5\' 5"  (1.651 m), weight 284 lb (128.822 kg), last menstrual period 08/01/2013, SpO2 100 %, unknown if currently breastfeeding.  Physical Exam:  General: alert, cooperative, appears stated age and no distress Lochia: appropriate Uterine Fundus: firm Incision: n/a DVT Evaluation: No evidence of DVT seen on physical exam. Negative Homan's sign. No cords or calf tenderness. No significant calf/ankle edema.   Recent Labs  04/23/14 0647  HGB 10.1*  HCT 32.3*    Assessment/Plan: Plan for discharge tomorrow   LOS: 1 day   Brigitte Soderberg DARLENE 04/24/2014, 5:03 AM

## 2014-04-24 NOTE — Plan of Care (Signed)
Problem: Phase II Progression Outcomes Goal: Pain controlled on oral analgesia Outcome: Completed/Met Date Met:  04/24/14 Goal: Afebrile, VS remain stable Outcome: Completed/Met Date Met:  04/24/14

## 2014-04-24 NOTE — Plan of Care (Signed)
Problem: Phase II Progression Outcomes Goal: Progress activity as tolerated unless otherwise ordered Outcome: Completed/Met Date Met:  04/24/14 Goal: Other Phase II Outcomes/Goals Outcome: Completed/Met Date Met:  04/24/14

## 2014-04-24 NOTE — Plan of Care (Signed)
Problem: Discharge Progression Outcomes Goal: Tolerating diet Outcome: Completed/Met Date Met:  04/24/14

## 2014-04-24 NOTE — Lactation Note (Signed)
This note was copied from the chart of Girl Aniceto Bossngela Kloster. Lactation Consultation Note Mom has 1 yr. Old which she pumped and bottle fed for 3 months. Plans on doing the same, pump and bottle feeding. Moms nipples are Very Large and probably couldn't latch d/t that reason.  Mom shown how to use DEBP & how to disassemble, clean, & reassemble parts. #30 flanges fitted for moms nipples. Mom knows to pump q3h for 15-20 min. Expressed the importance of supply and demand, stimulating the breast, hand massaging while pumping, and the first 2 weeks of stimulating breast to lay the foundation of future milk supply. Mom has WIC and will see about getting DEBP from them. Until then she will use a hand pump. Mom knows how to hand express. Mom encouraged to feed baby 8-12 times/24 hours and with feeding cues. Mom encouraged to waken baby for feeds. Mom encouraged to do skin-to-skin. Reviewed Baby & Me book's Breastfeeding Basics. Mom reports + breast changes w/pregnancy. WH/LC brochure given w/resources, support groups and LC services. LPI information sheet given and reviewed.   Patient Name: Girl Aniceto Bossngela Cillo ZOXWR'UToday's Date: 04/24/2014  Reason for consult: Initial assessment;Late preterm infant   Maternal Data Has patient been taught Hand Expression?: Yes Does the patient have breastfeeding experience prior to this delivery?: Yes  Feeding Feeding Type: Bottle Fed - Formula Nipple Type: Slow - flow  LATCH Score/Interventions                      Lactation Tools Discussed/Used Tools: Pump Breast pump type: Double-Electric Breast Pump WIC Program: Yes Pump Review: Setup, frequency, and cleaning;Milk Storage Initiated by:: Marti RN/L. Ethanael Veith RN Date initiated:: 04/24/14   Consult Status Consult Status: Follow-up Date: 04/24/14 Follow-up type: In-patient    Tenille Morrill, Diamond NickelLAURA G 04/24/2014, 1:21 AM

## 2014-04-25 ENCOUNTER — Encounter: Payer: Medicaid Other | Admitting: Obstetrics & Gynecology

## 2014-04-25 MED ORDER — IBUPROFEN 600 MG PO TABS: 600.0000 mg | ORAL_TABLET | Freq: Four times a day (QID) | ORAL | Status: DC

## 2014-04-25 NOTE — Discharge Summary (Signed)
Obstetric Discharge Summary Reason for Admission: onset of labor Prenatal Procedures: none Intrapartum Procedures: spontaneous vaginal delivery Postpartum Procedures: none Complications-Operative and Postpartum: none  Delivery Note At 5:16 PM a viable female was delivered via Vaginal, Spontaneous Delivery (Presentation: Right Occiput Anterior).  APGAR: 9, 9; weight 6 lb 4.4 oz (2845 g).   Placenta status: Intact, Spontaneous.  Cord: 3 vessels with the following complications: Short.    Anesthesia: Epidural  Episiotomy: None Lacerations: None Suture Repair: n/a Est. Blood Loss (mL): 100  Mom to postpartum.  Baby to Couplet care / Skin to Skin.  Elita BooneRoberts, Marjean Imperato C 04/25/2014, 8:52 AM     Hospital Course:  Active Problems:   Indication for care in labor or delivery   Today: No acute events overnight.  Pt denies problems with ambulating, voiding or po intake.  She denies nausea or vomiting.  Pain is moderately controlled.  She has had flatus. She has not had bowel movement.  Lochia Small.  Plan for birth control is  oral progesterone-only contraceptive.  Method of Feeding: formula  Margretta Dittyngela N Rumery is a [age] y.o. [id number] s/p NSVD.  Patient presented to [facility]  with contractions and was admitted to L&D.  She has postpartum course that was uncomplicated including no problems with ambulating, PO intake, urination, pain, or bleeding. The pt feels ready to go home and  will be discharged with outpatient follow-up.    H/H: Lab Results  Component Value Date/Time   HGB 10.1* 04/23/2014 06:47 AM   HCT 32.3* 04/23/2014 06:47 AM    Discharge Diagnoses: Term Pregnancy-delivered  Discharge Information: Date: 04/25/2014 Activity: pelvic rest Diet: routine  Medications: PNV and Ibuprofen Breast feeding:  Yes Condition: stable Instructions: refer to handout Discharge to: home   Discharge Instructions    Activity as tolerated    Complete by:  As directed      Call MD for:   difficulty breathing, headache or visual disturbances    Complete by:  As directed      Call MD for:  extreme fatigue    Complete by:  As directed      Call MD for:  hives    Complete by:  As directed      Call MD for:  persistant dizziness or light-headedness    Complete by:  As directed      Call MD for:  persistant nausea and vomiting    Complete by:  As directed      Call MD for:  redness, tenderness, or signs of infection (pain, swelling, redness, odor or green/yellow discharge around incision site)    Complete by:  As directed      Call MD for:  severe uncontrolled pain    Complete by:  As directed      Call MD for:  temperature >100.4    Complete by:  As directed      Diet - low sodium heart healthy    Complete by:  As directed      Discharge instructions    Complete by:  As directed   Taking care of yourself after Baby arrives. Vaginal Bleeding: Vaginal bleeding is common after delivery, with the amount decreasing gradually over 1-2 weeks. If the bleeding increases, is mixed with pus, or is foul-smelling, call your doctor, as this may be a sign of infection.  Abdominal Pain: Abdominal cramping after delivery is common, especially when you breastfeed. The same hormones responsible for letting milk down to your nipple also contract your  uterus. If the pain worsens, or occurs more frequently over 48 hrs after delivery, call your doctor.  Fevers: After delivery you are at increased risk of developing an infection. If you have a fever, increased vaginal bleeding, foul-smelling vaginal discharge, or increased abdominal pain, call your doctor.  Breast Feeding: Feeding every 1.5-3 hours keeps Baby satisfied and your milk in good supply. If 3 hours have gone by and Baby is sleeping, wake him/her up to feed. Nurse for 15-20 minutes on one breast before offering the other. Breast-fed babies often lose up to 7% of their birth weight in the first few days of life, but should start gaining about  an ounce per day after 4 days. For more information about breastfeeding, go to [email or web address]://[email or web address]/breastfeeding.  Mastitis (Breast infection): Breaks in the skin or bacteria passing into your breast ducts can cause an infection. If you notice a triangular shaped area on your breast that is red, warm to the touch and tender, call your doctor. It is safe for Baby and helps you to heal faster if you keep breast feeding through this infection.  Postpartum Depression: Postpartum depression is very common after a woman delivers because of all the hormonal changes happening in her body. If you notice that you start to feel more sad or anxious than usual or have any thoughts of hurting yourself or Baby, tell someone right away.  If you have any questions or concerns, please call your doctor.     Sexual acrtivity    Complete by:  As directed   6 wks postpartum            Medication List    TAKE these medications        ibuprofen 600 MG tablet  Commonly known as:  ADVIL,MOTRIN  Take 1 tablet (600 mg total) by mouth every 6 (six) hours.     prenatal multivitamin Tabs tablet  Take 1 tablet by mouth daily.         Elita Booneoberts, Tateanna Bach C ,MD OB Fellow 04/25/2014,8:52 AM

## 2014-04-25 NOTE — Plan of Care (Signed)
Problem: Consults Goal: Postpartum Patient Education (See Patient Education module for education specifics.)  Outcome: Completed/Met Date Met:  04/25/14  Problem: Discharge Progression Outcomes Goal: Barriers To Progression Addressed/Resolved Outcome: Completed/Met Date Met:  04/25/14 Goal: Activity appropriate for discharge plan Outcome: Completed/Met Date Met:  11/94/17 Goal: Complications resolved/controlled Outcome: Completed/Met Date Met:  04/25/14 Goal: Pain controlled with appropriate interventions Outcome: Completed/Met Date Met:  04/25/14 Goal: Afebrile, VS remain stable at discharge Outcome: Completed/Met Date Met:  04/25/14 Goal: Discharge plan in place and appropriate Outcome: Completed/Met Date Met:  04/25/14 Goal: Other Discharge Outcomes/Goals Outcome: Completed/Met Date Met:  04/25/14

## 2014-04-25 NOTE — Lactation Note (Signed)
This note was copied from the chart of Angela Mcclure Champney. Lactation Consultation Note; Mom bottle feeding formula when I went into room. States she has not pumped yet. Will start when she gets home. DEBP set up in room. Reviewed supply and demand. Discussed engorgement prevention and treatment. Plans to use manual pump at home. No questions at present. To call prn  Patient Name: Angela Mcclure Pfefferkorn ZOXWR'UToday's Date: 04/25/2014 Reason for consult: Follow-up assessment   Maternal Data Formula Feeding for Exclusion: Yes Reason for exclusion: Mother's choice to formula and breast feed on admission Does the patient have breastfeeding experience prior to this delivery?: Yes  Feeding    LATCH Score/Interventions                      Lactation Tools Discussed/Used     Consult Status Consult Status: Complete    Pamelia HoitWeeks, Yuridiana Formanek D 04/25/2014, 9:35 AM

## 2014-05-25 ENCOUNTER — Encounter: Payer: Self-pay | Admitting: Advanced Practice Midwife

## 2014-05-25 ENCOUNTER — Ambulatory Visit (INDEPENDENT_AMBULATORY_CARE_PROVIDER_SITE_OTHER): Payer: Medicaid Other | Admitting: Advanced Practice Midwife

## 2014-05-25 DIAGNOSIS — Z3202 Encounter for pregnancy test, result negative: Secondary | ICD-10-CM

## 2014-05-25 LAB — POCT URINALYSIS DIPSTICK
Glucose, UA: NEGATIVE
Glucose, UA: NEGATIVE
Protein, UA: 1

## 2014-05-25 LAB — POCT URINE PREGNANCY: PREG TEST UR: NEGATIVE

## 2014-05-25 MED ORDER — NORETHIN-ETH ESTRAD-FE BIPHAS 1 MG-10 MCG / 10 MCG PO TABS: 1.0000 | ORAL_TABLET | Freq: Every day | ORAL | Status: DC

## 2014-05-25 NOTE — Progress Notes (Signed)
  Margretta Dittyngela N Cosgriff is a 23 y.o. who presents for a postpartum visit. She is 5 weeks postpartum following a spontaneous vaginal delivery. I have fully reviewed the prenatal and intrapartum course. The delivery was at 38 gestational weeks.  Anesthesia: epidural. Postpartum course has been uneventful. Baby's course has been uneventful. Baby is feeding by bottle. Bleeding: staining only. Bowel function is normal. Bladder function is normal. Patient is not sexually active. Contraception method is none. Postpartum depression screening: negative.    Review of Systems   Constitutional: Negative for fever and chills Eyes: Negative for visual disturbances Respiratory: Negative for shortness of breath, dyspnea Cardiovascular: Negative for chest pain or palpitations  Gastrointestinal: Negative for vomiting, diarrhea and constipation Genitourinary: Negative for dysuria and urgency Musculoskeletal: Negative for back pain, joint pain, myalgias  Neurological: Negative for dizziness and headaches   Objective:     Filed Vitals:   05/25/14 1405  BP: 140/80   General:  alert, cooperative and no distress   Breasts:  negative  Lungs: clear to auscultation bilaterally  Heart:  regular rate and rhythm  Abdomen: Soft, nontender   Vulva:  normal  Vagina: normal vagina  Cervix:  closed  Corpus: Well involuted     Rectal Exam: no hemorrhoids        Assessment:    normal postpartum exam.  Plan:    1. Contraception: OCP (estrogen/progesterone)LoLoestrin 2. Follow up in: 3 months for med check or as needed.

## 2014-05-25 NOTE — Patient Instructions (Signed)
Oral Contraception Use Oral contraceptive pills (OCPs) are medicines taken to prevent pregnancy. OCPs work by preventing the ovaries from releasing eggs. The hormones in OCPs also cause the cervical mucus to thicken, preventing the sperm from entering the uterus. The hormones also cause the uterine lining to become thin, not allowing a fertilized egg to attach to the inside of the uterus. OCPs are highly effective when taken exactly as prescribed. However, OCPs do not prevent sexually transmitted diseases (STDs). Safe sex practices, such as using condoms along with an OCP, can help prevent STDs. Before taking OCPs, you may have a physical exam and Pap test. Your health care provider may also order blood tests if necessary. Your health care provider will make sure you are a good candidate for oral contraception. Discuss with your health care provider the possible side effects of the OCP you may be prescribed. When starting an OCP, it can take 2 to 3 months for the body to adjust to the changes in hormone levels in your body.  HOW TO TAKE ORAL CONTRACEPTIVE PILLS Your health care provider may advise you on how to start taking the first cycle of OCPs. Otherwise, you can:   Start on day 1 of your menstrual period. You will not need any backup contraceptive protection with this start time.   Start on the first Sunday after your menstrual period or the day you get your prescription. In these cases, you will need to use backup contraceptive protection for the first week.   Start the pill at any time of your cycle. If you take the pill within 5 days of the start of your period, you are protected against pregnancy right away. In this case, you will not need a backup form of birth control. If you start at any other time of your menstrual cycle, you will need to use another form of birth control for 7 days. If your OCP is the type called a minipill, it will protect you from pregnancy after taking it for 2 days (48  hours). After you have started taking OCPs:   If you forget to take 1 pill, take it as soon as you remember. Take the next pill at the regular time.   If you miss 2 or more pills, call your health care provider because different pills have different instructions for missed doses. Use backup birth control until your next menstrual period starts.   If you use a 28-day pack that contains inactive pills and you miss 1 of the last 7 pills (pills with no hormones), it will not matter. Throw away the rest of the non-hormone pills and start a new pill pack.  No matter which day you start the OCP, you will always start a new pack on that same day of the week. Have an extra pack of OCPs and a backup contraceptive method available in case you miss some pills or lose your OCP pack.  HOME CARE INSTRUCTIONS   Do not smoke.   Always use a condom to protect against STDs. OCPs do not protect against STDs.   Use a calendar to mark your menstrual period days.   Read the information and directions that came with your OCP. Talk to your health care provider if you have questions.  SEEK MEDICAL CARE IF:   You develop nausea and vomiting.   You have abnormal vaginal discharge or bleeding.   You develop a rash.   You miss your menstrual period.   You are losing   your hair.   You need treatment for mood swings or depression.   You get dizzy when taking the OCP.   You develop acne from taking the OCP.   You become pregnant.  SEEK IMMEDIATE MEDICAL CARE IF:   You develop chest pain.   You develop shortness of breath.   You have an uncontrolled or severe headache.   You develop numbness or slurred speech.   You develop visual problems.   You develop pain, redness, and swelling in the legs.  Document Released: 05/22/2011 Document Revised: 10/17/2013 Document Reviewed: 11/21/2012 Exit[employer] Patient Information 2015 [employer]. This information is not intended to replace  advice given to you by your health care provider. Make sure you discuss any questions you have with your health care provider.  

## 2014-06-05 ENCOUNTER — Ambulatory Visit: Payer: Medicaid Other | Admitting: Women's Health

## 2014-06-16 NOTE — L&D Delivery Note (Cosign Needed)
Delivery Note  Patient arrived on the floor from the MAU complete and with the urge to push. She delivered within 3-4 contractions. No significant fetal heart rate decels.  At 11:21 AM a viable female was delivered via Vaginal, Spontaneous Delivery (Presentation: ; Occiput Anterior).  APGAR: 8, 9; weight 3480 g.   Placenta status: Intact, Spontaneous.  Cord: 3 vessels with the following complications: None.  Cord pH: not obtained  Anesthesia: Epidural  Episiotomy: None Lacerations: None Est. Blood Loss (mL): 200 ml  Mom to postpartum.  Baby to Couplet care / Skin to Skin.  Angela Mcclure 03/29/2015, 1:09 PM

## 2014-08-24 ENCOUNTER — Ambulatory Visit: Payer: Medicaid Other | Admitting: Advanced Practice Midwife

## 2014-08-30 ENCOUNTER — Ambulatory Visit: Payer: Medicaid Other | Admitting: Advanced Practice Midwife

## 2014-10-24 ENCOUNTER — Other Ambulatory Visit: Payer: Self-pay | Admitting: Obstetrics & Gynecology

## 2014-10-24 DIAGNOSIS — O3680X1 Pregnancy with inconclusive fetal viability, fetus 1: Secondary | ICD-10-CM

## 2014-10-25 ENCOUNTER — Other Ambulatory Visit: Payer: Self-pay | Admitting: Obstetrics & Gynecology

## 2014-10-25 ENCOUNTER — Encounter: Payer: Self-pay | Admitting: Advanced Practice Midwife

## 2014-10-25 ENCOUNTER — Ambulatory Visit (INDEPENDENT_AMBULATORY_CARE_PROVIDER_SITE_OTHER): Payer: Medicaid Other

## 2014-10-25 DIAGNOSIS — O3680X1 Pregnancy with inconclusive fetal viability, fetus 1: Secondary | ICD-10-CM | POA: Diagnosis not present

## 2014-10-25 NOTE — Progress Notes (Signed)
US 1631w1d single IUP w/pos fht 153bpm.post pl,cx 3.6cm,afi 4.3cm sdp of fluid,breech,normal ov's bilat,pt will schedule anatomy scan in 3-4 wks.

## 2014-11-08 ENCOUNTER — Encounter: Payer: Self-pay | Admitting: Women's Health

## 2014-11-08 ENCOUNTER — Other Ambulatory Visit (HOSPITAL_COMMUNITY)
Admission: RE | Admit: 2014-11-08 | Discharge: 2014-11-08 | Disposition: A | Discharge status: Home / Self Care | Payer: Medicaid Other | Source: Ambulatory Visit | Attending: Obstetrics & Gynecology | Admitting: Obstetrics & Gynecology

## 2014-11-08 ENCOUNTER — Ambulatory Visit (INDEPENDENT_AMBULATORY_CARE_PROVIDER_SITE_OTHER): Payer: Medicaid Other | Admitting: Women's Health

## 2014-11-08 VITALS — BP 110/60 | HR 80 | Wt 272.0 lb

## 2014-11-08 DIAGNOSIS — Z1389 Encounter for screening for other disorder: Secondary | ICD-10-CM

## 2014-11-08 DIAGNOSIS — Z369 Encounter for antenatal screening, unspecified: Secondary | ICD-10-CM

## 2014-11-08 DIAGNOSIS — O09899 Supervision of other high risk pregnancies, unspecified trimester: Secondary | ICD-10-CM | POA: Insufficient documentation

## 2014-11-08 DIAGNOSIS — Z3482 Encounter for supervision of other normal pregnancy, second trimester: Secondary | ICD-10-CM

## 2014-11-08 DIAGNOSIS — Z01419 Encounter for gynecological examination (general) (routine) without abnormal findings: Secondary | ICD-10-CM | POA: Insufficient documentation

## 2014-11-08 DIAGNOSIS — Z331 Pregnant state, incidental: Secondary | ICD-10-CM

## 2014-11-08 DIAGNOSIS — Z3492 Encounter for supervision of normal pregnancy, unspecified, second trimester: Secondary | ICD-10-CM | POA: Diagnosis not present

## 2014-11-08 DIAGNOSIS — Z1151 Encounter for screening for human papillomavirus (HPV): Secondary | ICD-10-CM | POA: Insufficient documentation

## 2014-11-08 DIAGNOSIS — Z363 Encounter for antenatal screening for malformations: Secondary | ICD-10-CM

## 2014-11-08 NOTE — Patient Instructions (Signed)
Second Trimester of Pregnancy The second trimester is from week 13 through week 28, months 4 through 6. The second trimester is often a time when you feel your best. Your body has also adjusted to being pregnant, and you begin to feel better physically. Usually, morning sickness has lessened or quit completely, you may have more energy, and you may have an increase in appetite. The second trimester is also a time when the fetus is growing rapidly. At the end of the sixth month, the fetus is about 9 inches long and weighs about 1 pounds. You will likely begin to feel the baby move (quickening) between 18 and 20 weeks of the pregnancy. BODY CHANGES Your body goes through many changes during pregnancy. The changes vary from woman to woman.   Your weight will continue to increase. You will notice your lower abdomen bulging out.  You may begin to get stretch marks on your hips, abdomen, and breasts.  You may develop headaches that can be relieved by medicines approved by your health care provider.  You may urinate more often because the fetus is pressing on your bladder.  You may develop or continue to have heartburn as a result of your pregnancy.  You may develop constipation because certain hormones are causing the muscles that push waste through your intestines to slow down.  You may develop hemorrhoids or swollen, bulging veins (varicose veins).  You may have back pain because of the weight gain and pregnancy hormones relaxing your joints between the bones in your pelvis and as a result of a shift in weight and the muscles that support your balance.  Your breasts will continue to grow and be tender.  Your gums may bleed and may be sensitive to brushing and flossing.  Dark spots or blotches (chloasma, mask of pregnancy) may develop on your face. This will likely fade after the baby is born.  A dark line from your belly button to the pubic area (linea nigra) may appear. This will likely fade  after the baby is born.  You may have changes in your hair. These can include thickening of your hair, rapid growth, and changes in texture. Some women also have hair loss during or after pregnancy, or hair that feels dry or thin. Your hair will most likely return to normal after your baby is born. WHAT TO EXPECT AT YOUR PRENATAL VISITS During a routine prenatal visit:  You will be weighed to make sure you and the fetus are growing normally.  Your blood pressure will be taken.  Your abdomen will be measured to track your baby's growth.  The fetal heartbeat will be listened to.  Any test results from the previous visit will be discussed. Your health care provider may ask you:  How you are feeling.  If you are feeling the baby move.  If you have had any abnormal symptoms, such as leaking fluid, bleeding, severe headaches, or abdominal cramping.  If you have any questions. Other tests that may be performed during your second trimester include:  Blood tests that check for:  Low iron levels (anemia).  Gestational diabetes (between 24 and 28 weeks).  Rh antibodies.  Urine tests to check for infections, diabetes, or protein in the urine.  An ultrasound to confirm the proper growth and development of the baby.  An amniocentesis to check for possible genetic problems.  Fetal screens for spina bifida and Down syndrome. HOME CARE INSTRUCTIONS   Avoid all smoking, herbs, alcohol, and unprescribed   drugs. These chemicals affect the formation and growth of the baby.  Follow your health care provider's instructions regarding medicine use. There are medicines that are either safe or unsafe to take during pregnancy.  Exercise only as directed by your health care provider. Experiencing uterine cramps is a good sign to stop exercising.  Continue to eat regular, healthy meals.  Wear a good support bra for breast tenderness.  Do not use hot tubs, steam rooms, or saunas.  Wear your  seat belt at all times when driving.  Avoid raw meat, uncooked cheese, cat litter boxes, and soil used by cats. These carry germs that can cause birth defects in the baby.  Take your prenatal vitamins.  Try taking a stool softener (if your health care provider approves) if you develop constipation. Eat more high-fiber foods, such as fresh vegetables or fruit and whole grains. Drink plenty of fluids to keep your urine clear or pale yellow.  Take warm sitz baths to soothe any pain or discomfort caused by hemorrhoids. Use hemorrhoid cream if your health care provider approves.  If you develop varicose veins, wear support hose. Elevate your feet for 15 minutes, 3-4 times a day. Limit salt in your diet.  Avoid heavy lifting, wear low heel shoes, and practice good posture.  Rest with your legs elevated if you have leg cramps or low back pain.  Visit your dentist if you have not gone yet during your pregnancy. Use a soft toothbrush to brush your teeth and be gentle when you floss.  A sexual relationship may be continued unless your health care provider directs you otherwise.  Continue to go to all your prenatal visits as directed by your health care provider. SEEK MEDICAL CARE IF:   You have dizziness.  You have mild pelvic cramps, pelvic pressure, or nagging pain in the abdominal area.  You have persistent nausea, vomiting, or diarrhea.  You have a bad smelling vaginal discharge.  You have pain with urination. SEEK IMMEDIATE MEDICAL CARE IF:   You have a fever.  You are leaking fluid from your vagina.  You have spotting or bleeding from your vagina.  You have severe abdominal cramping or pain.  You have rapid weight gain or loss.  You have shortness of breath with chest pain.  You notice sudden or extreme swelling of your face, hands, ankles, feet, or legs.  You have not felt your baby move in over an hour.  You have severe headaches that do not go away with  medicine.  You have vision changes. Document Released: 05/27/2001 Document Revised: 06/07/2013 Document Reviewed: 08/03/2012 [employer] Patient Information 2015 [employer]. This information is not intended to replace advice given to you by your health care provider. Make sure you discuss any questions you have with your health care provider.  

## 2014-11-08 NOTE — Progress Notes (Signed)
  Subjective:  Angela Mcclure is a 24 y.o. 193P2002 African American female at 2065w1d by 17wk u/s, being seen today for her first obstetrical visit.  She didn't know she was this far along until u/s, HD had told her based on 'LMP' she was 10wks, but 'LMP' was likely 1st/2nd trimester bleeding. Her obstetrical history is significant for obesity, short interval pregnancy- last birth 04/23/14, birth before that 08/27/12. 2 term uncomplicated svb's. History GHTN first pregnancy- none 2nd pregnancy. Pregnancy history fully reviewed.  Patient reports no complaints. Denies vb, cramping, uti s/s, abnormal/malodorous vag d/c, or vulvovaginal itching/irritation.  BP 110/60 mmHg  Pulse 80  Wt 272 lb (123.378 kg)  LMP 08/06/2014 (Approximate)  HISTORY: OB History  Gravida Para Term Preterm AB SAB TAB Ectopic Multiple Living  3 2 2       0 2    # Outcome Date GA Lbr Len/2nd Weight Sex Delivery Anes PTL Lv  3 Current           2 Term 04/23/14 6661w6d / 00:10 6 lb 4.4 oz (2.845 kg) F Vag-Spont EPI  Y  1 Term 08/26/12 1223w1d 29:33 / 02:21 6 lb 1 oz (2.75 kg) M Vag-Vacuum EPI  Y     Past Medical History  Diagnosis Date  . Asthma    Past Surgical History  Procedure Laterality Date  . No past surgeries     Family History  Problem Relation Age of Onset  . Diabetes Other   . Hypertension Other   . Stroke Other     Exam   System:     General: Well developed & nourished, no acute distress   Skin: Warm & dry, normal coloration and turgor, no rashes   Neurologic: Alert & oriented, normal mood   Cardiovascular: Regular rate & rhythm   Respiratory: Effort & rate normal, LCTAB, acyanotic   Abdomen: Soft, non tender   Extremities: normal strength, tone   Pelvic Exam:    Perineum: Normal perineum   Vulva: Normal, no lesions   Vagina:  Normal mucosa, normal discharge   Cervix: Normal, bulbous, appears closed   Uterus: Normal size/shape/contour for GA   Thin prep pap smear obtained w/ reflex high risk  HPV cotesting FHR: 160 via doppler   Assessment:   Pregnancy: [id number] Patient Active Problem List   Diagnosis Date Noted  . Supervision of other normal pregnancy 10/18/2013    Priority: High  . Obesity 10/18/2013    Priority: High  . History of gestational hypertension 10/18/2013    Priority: High  . Asthma 08/18/2012    3565w1d G3P2002 New OB visit Late care Likely 1st/2nd trimester bleeding, none now Short interval pregnancy Obesity H/O GHTN 1st pregnancy  Plan:  Initial labs drawn, including AFP Continue prenatal vitamins Problem list reviewed and updated Reviewed n/v relief measures and warning s/s to report Reviewed recommended weight gain based on pre-gravid BMI Encouraged well-balanced diet Genetic Screening discussed Quad Screen: requested and ordered Cystic fibrosis screening discussed neg prev preg Ultrasound discussed; fetal survey: requested Follow up in 2 weeks for anatomy u/s and visit [facility] completed  Marge DuncansBooker, Keenon Leitzel Randall CNM, [facility] HospitalWHNP-BC 11/08/2014 12:17 PM

## 2014-11-09 LAB — PMP SCREEN PROFILE (10S), URINE
AMPHETAMINE SCRN UR: NEGATIVE ng/mL
BENZODIAZEPINE SCREEN, URINE: NEGATIVE ng/mL
Barbiturate Screen, Ur: NEGATIVE ng/mL
CANNABINOIDS UR QL SCN: NEGATIVE ng/mL
Cocaine(Metab.)Screen, Urine: NEGATIVE ng/mL
Creatinine(Crt), U: 342.1 mg/dL — ABNORMAL HIGH (ref 20.0–300.0)
Methadone Scn, Ur: NEGATIVE ng/mL
Opiate Scrn, Ur: NEGATIVE ng/mL
Oxycodone+Oxymorphone Ur Ql Scn: NEGATIVE ng/mL
PCP SCRN UR: NEGATIVE ng/mL
Ph of Urine: 5.7 (ref 4.5–8.9)
Propoxyphene, Screen: NEGATIVE ng/mL

## 2014-11-09 LAB — RUBELLA SCREEN: Rubella Antibodies, IGG: 7.9 index (ref >0.99)

## 2014-11-09 LAB — URINALYSIS, ROUTINE W REFLEX MICROSCOPIC
BILIRUBIN UA: NEGATIVE
Glucose, UA: NEGATIVE
Ketones, UA: NEGATIVE
LEUKOCYTES UA: NEGATIVE
NITRITE UA: NEGATIVE
PH UA: 6 (ref 5.0–7.5)
RBC, UA: NEGATIVE
Specific Gravity, UA: >=1.03 — AB (ref 1.005–1.030)
UUROB: 1 mg/dL (ref 0.2–1.0)

## 2014-11-09 LAB — CBC
Hematocrit: 35.3 % (ref 34.0–46.6)
Hemoglobin: 11 g/dL — ABNORMAL LOW (ref 11.1–15.9)
MCH: 23.4 pg — ABNORMAL LOW (ref 26.6–33.0)
MCHC: 31.2 g/dL — ABNORMAL LOW (ref 31.5–35.7)
MCV: 75 fL — ABNORMAL LOW (ref 79–97)
Platelets: 251 10*3/uL (ref 150–379)
RBC: 4.71 x10E6/uL (ref 3.77–5.28)
RDW: 17.2 % — ABNORMAL HIGH (ref 12.3–15.4)
WBC: 8.8 10*3/uL (ref 3.4–10.8)

## 2014-11-09 LAB — RPR: RPR: NONREACTIVE

## 2014-11-09 LAB — MICROSCOPIC EXAMINATION
Casts: NONE SEEN /lpf
Epithelial Cells (non renal): >10 /hpf — AB (ref 0–10)

## 2014-11-09 LAB — ANTIBODY SCREEN: Antibody Screen: NEGATIVE

## 2014-11-09 LAB — HEPATITIS B SURFACE ANTIGEN: HEP B S AG: NEGATIVE

## 2014-11-09 LAB — ABO/RH: RH TYPE: POSITIVE

## 2014-11-09 LAB — HIV ANTIBODY (ROUTINE TESTING W REFLEX): HIV Screen 4th Generation wRfx: NONREACTIVE

## 2014-11-09 LAB — SICKLE CELL SCREEN: Sickle Cell Screen: NEGATIVE

## 2014-11-10 LAB — AFP, QUAD SCREEN
DIA MOM VALUE: 0.79
DIA VALUE (EIA): 110.95 pg/mL
DSR (By Age)    1 IN: 1012
DSR (SECOND TRIMESTER) 1 IN: 3170
GESTATIONAL AGE AFP: 19.1 wk
MATERNAL AGE AT EDD: 25.4 a
MSAFP MOM: 0.67
MSAFP: 26.8 ng/mL
MSHCG Mom: 1.51
MSHCG: 26136 m[IU]/mL
Osb Risk: 10000
T18 (By Age): 1:3944 {titer}
Test Results:: NEGATIVE
WEIGHT: 272 [lb_av]
uE3 Mom: 0.89
uE3 Value: 1.32 ng/mL

## 2014-11-10 LAB — URINE CULTURE

## 2014-11-10 LAB — CYTOLOGY - PAP

## 2014-11-15 ENCOUNTER — Telehealth: Payer: Self-pay | Admitting: *Deleted

## 2014-11-15 NOTE — Telephone Encounter (Signed)
Pt was needing the work note Drenda FreezeFran wrote on 05/11 faxed to attn: Marcelino DusterMichelle @ [phone].  Letter printed and faxed and pt informed.

## 2014-11-20 ENCOUNTER — Other Ambulatory Visit: Payer: Self-pay | Admitting: Women's Health

## 2014-11-20 DIAGNOSIS — O99212 Obesity complicating pregnancy, second trimester: Secondary | ICD-10-CM

## 2014-11-20 DIAGNOSIS — Z363 Encounter for antenatal screening for malformations: Secondary | ICD-10-CM

## 2014-11-20 DIAGNOSIS — O09892 Supervision of other high risk pregnancies, second trimester: Secondary | ICD-10-CM

## 2014-11-21 ENCOUNTER — Telehealth: Payer: Self-pay | Admitting: *Deleted

## 2014-11-21 NOTE — Telephone Encounter (Signed)
Pt states "what can I take for a head cold." Pt informed can take robitussin or Delsym, if allergy related Zyrtec or Claritin. If no improvement pt to call our office back. Pt verbalized understanding.

## 2014-11-22 ENCOUNTER — Ambulatory Visit (INDEPENDENT_AMBULATORY_CARE_PROVIDER_SITE_OTHER): Payer: Medicaid Other | Admitting: Advanced Practice Midwife

## 2014-11-22 ENCOUNTER — Ambulatory Visit (INDEPENDENT_AMBULATORY_CARE_PROVIDER_SITE_OTHER): Payer: Medicaid Other

## 2014-11-22 VITALS — BP 120/60 | HR 91 | Wt 274.0 lb

## 2014-11-22 DIAGNOSIS — O99212 Obesity complicating pregnancy, second trimester: Secondary | ICD-10-CM

## 2014-11-22 DIAGNOSIS — Z3482 Encounter for supervision of other normal pregnancy, second trimester: Secondary | ICD-10-CM

## 2014-11-22 DIAGNOSIS — O09892 Supervision of other high risk pregnancies, second trimester: Secondary | ICD-10-CM | POA: Diagnosis not present

## 2014-11-22 DIAGNOSIS — Z363 Encounter for antenatal screening for malformations: Secondary | ICD-10-CM

## 2014-11-22 DIAGNOSIS — Z36 Encounter for antenatal screening of mother: Secondary | ICD-10-CM

## 2014-11-22 LAB — POCT URINALYSIS DIPSTICK
Blood, UA: NEGATIVE
GLUCOSE UA: NEGATIVE
KETONES UA: NEGATIVE
LEUKOCYTES UA: NEGATIVE
NITRITE UA: NEGATIVE

## 2014-11-22 NOTE — Progress Notes (Signed)
US 21+1WKS measurements c/w dates,efw 439g,sdp of fluid 5.3cm,post pl gr 0,cx 4.6cm,normal ov's bilat,fht 150bpm,anatomy complete w/no obvious abn seen

## 2014-11-22 NOTE — Progress Notes (Signed)
[id number] 2863w1d Estimated Date of Delivery: 04/03/15  Blood pressure 120/60, pulse 91, weight 274 lb (124.286 kg), last menstrual period 08/06/2014, not currently breastfeeding.   BP weight and urine results all reviewed and noted.  Please refer to the obstetrical flow sheet for the fundal height and fetal heart rate documentation: US 21+1WKS measurements c/w dates,efw 439g,sdp of fluid 5.3cm,post pl gr 0,cx 4.6cm,normal ov's bilat,fht 150bpm,anatomy complete w/no obvious abn seen,  Normal female  Patient reports good fetal movement, denies any bleeding and no rupture of membranes symptoms or regular contractions. Patient is without complaints. All questions were answered.  Plan:  Continued routine obstetrical care,   Follow up in 3 weeks for OB appointment,

## 2014-12-13 ENCOUNTER — Ambulatory Visit (INDEPENDENT_AMBULATORY_CARE_PROVIDER_SITE_OTHER): Payer: Medicaid Other | Admitting: Women's Health

## 2014-12-13 ENCOUNTER — Encounter: Payer: Self-pay | Admitting: Women's Health

## 2014-12-13 VITALS — BP 134/62 | HR 76 | Wt 277.0 lb

## 2014-12-13 DIAGNOSIS — O2342 Unspecified infection of urinary tract in pregnancy, second trimester: Secondary | ICD-10-CM

## 2014-12-13 DIAGNOSIS — Z331 Pregnant state, incidental: Secondary | ICD-10-CM

## 2014-12-13 DIAGNOSIS — B373 Candidiasis of vulva and vagina: Secondary | ICD-10-CM

## 2014-12-13 DIAGNOSIS — Z1389 Encounter for screening for other disorder: Secondary | ICD-10-CM

## 2014-12-13 DIAGNOSIS — Z3482 Encounter for supervision of other normal pregnancy, second trimester: Secondary | ICD-10-CM

## 2014-12-13 DIAGNOSIS — L292 Pruritus vulvae: Secondary | ICD-10-CM

## 2014-12-13 DIAGNOSIS — B3731 Acute candidiasis of vulva and vagina: Secondary | ICD-10-CM

## 2014-12-13 DIAGNOSIS — R350 Frequency of micturition: Secondary | ICD-10-CM

## 2014-12-13 LAB — POCT WET PREP (WET MOUNT): Clue Cells Wet Prep Whiff POC: NEGATIVE

## 2014-12-13 LAB — POCT URINALYSIS DIPSTICK
Glucose, UA: NEGATIVE
KETONES UA: NEGATIVE
NITRITE UA: NEGATIVE

## 2014-12-13 MED ORDER — NITROFURANTOIN MONOHYD MACRO 100 MG PO CAPS: 100.0000 mg | ORAL_CAPSULE | Freq: Two times a day (BID) | ORAL | Status: DC

## 2014-12-13 NOTE — Patient Instructions (Addendum)
Monistat 7 for yeast infection  You will have your sugar test next visit.  Please do not eat or drink anything after midnight the night before you come, not even water.  You will be here for at least two hours.     Call the office ([phone]) or go to [facility] if:  You begin to have strong, frequent contractions  Your water breaks.  Sometimes it is a big gush of fluid, sometimes it is just a trickle that keeps getting your panties wet or running down your legs  You have vaginal bleeding.  It is normal to have a small amount of spotting if your cervix was checked.   You don't feel your baby moving like normal.  If you don't, get you something to eat and drink and lay down and focus on feeling your baby move.   If your baby is still not moving like normal, you should call the office or go to [facility].  Second Trimester of Pregnancy The second trimester is from week 13 through week 28, months 4 through 6. The second trimester is often a time when you feel your best. Your body has also adjusted to being pregnant, and you begin to feel better physically. Usually, morning sickness has lessened or quit completely, you may have more energy, and you may have an increase in appetite. The second trimester is also a time when the fetus is growing rapidly. At the end of the sixth month, the fetus is about 9 inches long and weighs about 1 pounds. You will likely begin to feel the baby move (quickening) between 18 and 20 weeks of the pregnancy. BODY CHANGES Your body goes through many changes during pregnancy. The changes vary from woman to woman.   Your weight will continue to increase. You will notice your lower abdomen bulging out.  You may begin to get stretch marks on your hips, abdomen, and breasts.  You may develop headaches that can be relieved by medicines approved by your health care provider.  You may urinate more often because the fetus is pressing on your bladder.  You may  develop or continue to have heartburn as a result of your pregnancy.  You may develop constipation because certain hormones are causing the muscles that push waste through your intestines to slow down.  You may develop hemorrhoids or swollen, bulging veins (varicose veins).  You may have back pain because of the weight gain and pregnancy hormones relaxing your joints between the bones in your pelvis and as a result of a shift in weight and the muscles that support your balance.  Your breasts will continue to grow and be tender.  Your gums may bleed and may be sensitive to brushing and flossing.  Dark spots or blotches (chloasma, mask of pregnancy) may develop on your face. This will likely fade after the baby is born.  A dark line from your belly button to the pubic area (linea nigra) may appear. This will likely fade after the baby is born.  You may have changes in your hair. These can include thickening of your hair, rapid growth, and changes in texture. Some women also have hair loss during or after pregnancy, or hair that feels dry or thin. Your hair will most likely return to normal after your baby is born. WHAT TO EXPECT AT YOUR PRENATAL VISITS During a routine prenatal visit:  You will be weighed to make sure you and the fetus are growing normally.  Your   blood pressure will be taken.  Your abdomen will be measured to track your baby's growth.  The fetal heartbeat will be listened to.  Any test results from the previous visit will be discussed. Your health care provider may ask you:  How you are feeling.  If you are feeling the baby move.  If you have had any abnormal symptoms, such as leaking fluid, bleeding, severe headaches, or abdominal cramping.  If you have any questions. Other tests that may be performed during your second trimester include:  Blood tests that check for:  Low iron levels (anemia).  Gestational diabetes (between 24 and 28 weeks).  Rh  antibodies.  Urine tests to check for infections, diabetes, or protein in the urine.  An ultrasound to confirm the proper growth and development of the baby.  An amniocentesis to check for possible genetic problems.  Fetal screens for spina bifida and Down syndrome. HOME CARE INSTRUCTIONS   Avoid all smoking, herbs, alcohol, and unprescribed drugs. These chemicals affect the formation and growth of the baby.  Follow your health care provider's instructions regarding medicine use. There are medicines that are either safe or unsafe to take during pregnancy.  Exercise only as directed by your health care provider. Experiencing uterine cramps is a good sign to stop exercising.  Continue to eat regular, healthy meals.  Wear a good support bra for breast tenderness.  Do not use hot tubs, steam rooms, or saunas.  Wear your seat belt at all times when driving.  Avoid raw meat, uncooked cheese, cat litter boxes, and soil used by cats. These carry germs that can cause birth defects in the baby.  Take your prenatal vitamins.  Try taking a stool softener (if your health care provider approves) if you develop constipation. Eat more high-fiber foods, such as fresh vegetables or fruit and whole grains. Drink plenty of fluids to keep your urine clear or pale yellow.  Take warm sitz baths to soothe any pain or discomfort caused by hemorrhoids. Use hemorrhoid cream if your health care provider approves.  If you develop varicose veins, wear support hose. Elevate your feet for 15 minutes, 3-4 times a day. Limit salt in your diet.  Avoid heavy lifting, wear low heel shoes, and practice good posture.  Rest with your legs elevated if you have leg cramps or low back pain.  Visit your dentist if you have not gone yet during your pregnancy. Use a soft toothbrush to brush your teeth and be gentle when you floss.  A sexual relationship may be continued unless your health care provider directs you  otherwise.  Continue to go to all your prenatal visits as directed by your health care provider. SEEK MEDICAL CARE IF:   You have dizziness.  You have mild pelvic cramps, pelvic pressure, or nagging pain in the abdominal area.  You have persistent nausea, vomiting, or diarrhea.  You have a bad smelling vaginal discharge.  You have pain with urination. SEEK IMMEDIATE MEDICAL CARE IF:   You have a fever.  You are leaking fluid from your vagina.  You have spotting or bleeding from your vagina.  You have severe abdominal cramping or pain.  You have rapid weight gain or loss.  You have shortness of breath with chest pain.  You notice sudden or extreme swelling of your face, hands, ankles, feet, or legs.  You have not felt your baby move in over an hour.  You have severe headaches that do not go away with  medicine.  You have vision changes. Document Released: 05/27/2001 Document Revised: 06/07/2013 Document Reviewed: 08/03/2012 [facility] Patient Information 2015 [place], [place]. This information is not intended to replace advice given to you by your health care provider. Make sure you discuss any questions you have with your health care provider.

## 2014-12-13 NOTE — Progress Notes (Signed)
Low-risk OB appointment [id number] 3850w1d Estimated Date of Delivery: 04/03/15 BP 134/62 mmHg  Pulse 76  Wt 277 lb (125.646 kg)  LMP 08/06/2014 (Approximate)  BP, weight, and urine reviewed.  Refer to obstetrical flow sheet for FH & FHR.  Reports good fm.  Denies regular uc's, lof, vb. Vulvar itching x 1 wk- no abnormal d/c or odor. Increased urinary frequency, urgency. Urine w/ tr blood, protein, leuks- will send for cx and tx w/ macrobid.  Spec exam: cx visually closed, mod amount thick clumpy white nonodorous d/c c/w yeast. Wet prep +yeast, otherwise neg. OTC Monistat 7 Reviewed ptl s/s, fm. Plan:  Continue routine obstetrical care  F/U in 4wsk for OB appointment and pn2

## 2014-12-15 LAB — URINE CULTURE

## 2015-01-10 ENCOUNTER — Other Ambulatory Visit: Payer: Medicaid Other

## 2015-01-10 ENCOUNTER — Encounter: Payer: Medicaid Other | Admitting: Advanced Practice Midwife

## 2015-01-18 ENCOUNTER — Other Ambulatory Visit: Payer: Medicaid Other

## 2015-01-18 ENCOUNTER — Encounter: Payer: Self-pay | Admitting: Advanced Practice Midwife

## 2015-01-18 ENCOUNTER — Ambulatory Visit (INDEPENDENT_AMBULATORY_CARE_PROVIDER_SITE_OTHER): Payer: Medicaid Other | Admitting: Advanced Practice Midwife

## 2015-01-18 VITALS — BP 102/50 | HR 100 | Wt 282.0 lb

## 2015-01-18 DIAGNOSIS — Z331 Pregnant state, incidental: Secondary | ICD-10-CM

## 2015-01-18 DIAGNOSIS — Z369 Encounter for antenatal screening, unspecified: Secondary | ICD-10-CM

## 2015-01-18 DIAGNOSIS — Z1389 Encounter for screening for other disorder: Secondary | ICD-10-CM

## 2015-01-18 DIAGNOSIS — Z131 Encounter for screening for diabetes mellitus: Secondary | ICD-10-CM

## 2015-01-18 DIAGNOSIS — Z3483 Encounter for supervision of other normal pregnancy, third trimester: Secondary | ICD-10-CM

## 2015-01-18 LAB — POCT URINALYSIS DIPSTICK
Glucose, UA: NEGATIVE
KETONES UA: NEGATIVE
LEUKOCYTES UA: NEGATIVE
Nitrite, UA: NEGATIVE
PROTEIN UA: NEGATIVE
RBC UA: NEGATIVE

## 2015-01-18 LAB — OB RESULTS CONSOLE HIV ANTIBODY (ROUTINE TESTING): HIV: NONREACTIVE

## 2015-01-18 NOTE — Progress Notes (Signed)
[id number] [redacted]w[redacted]d Estimated Date of Delivery: 04/03/15  Last menstrual period 08/06/2014, not currently breastfeeding.   BP weight and urine results all reviewed and noted.  Please refer to the obstetrical flow sheet for the fundal height and fetal heart rate documentation:  Patient reports good fetal movement, denies any bleeding and no rupture of membranes symptoms or regular contractions. Patient is without complaints. All questions were answered. Discussed difference bt not wanting any more kids and not being able to have kids for some women.  Will consider.   Plan:  Continued routine obstetrical care, PN2 today  Follow up in 3 weeks for OB appointment,

## 2015-01-19 LAB — CBC
HEMOGLOBIN: 10.7 g/dL — AB (ref 11.1–15.9)
Hematocrit: 33.4 % — ABNORMAL LOW (ref 34.0–46.6)
MCH: 23.7 pg — ABNORMAL LOW (ref 26.6–33.0)
MCHC: 32 g/dL (ref 31.5–35.7)
MCV: 74 fL — ABNORMAL LOW (ref 79–97)
Platelets: 255 10*3/uL (ref 150–379)
RBC: 4.52 x10E6/uL (ref 3.77–5.28)
RDW: 16.2 % — ABNORMAL HIGH (ref 12.3–15.4)
WBC: 9 10*3/uL (ref 3.4–10.8)

## 2015-01-19 LAB — HSV 2 ANTIBODY, IGG

## 2015-01-19 LAB — ANTIBODY SCREEN: Antibody Screen: NEGATIVE

## 2015-01-19 LAB — GLUCOSE TOLERANCE, 2 HOURS W/ 1HR
GLUCOSE, FASTING: 77 mg/dL (ref 65–91)
Glucose, 1 hour: 86 mg/dL (ref 65–179)
Glucose, 2 hour: 52 mg/dL — ABNORMAL LOW (ref 65–152)

## 2015-01-19 LAB — RPR: RPR Ser Ql: NONREACTIVE

## 2015-01-19 LAB — HIV ANTIBODY (ROUTINE TESTING W REFLEX): HIV SCREEN 4TH GENERATION: NONREACTIVE

## 2015-01-19 LAB — VARICELLA ZOSTER ANTIBODY, IGG: Varicella zoster IgG: 3966 index (ref >165)

## 2015-01-29 ENCOUNTER — Encounter: Payer: Self-pay | Admitting: Women's Health

## 2015-01-29 ENCOUNTER — Telehealth: Payer: Self-pay | Admitting: *Deleted

## 2015-01-29 ENCOUNTER — Ambulatory Visit (INDEPENDENT_AMBULATORY_CARE_PROVIDER_SITE_OTHER): Payer: Medicaid Other | Admitting: Women's Health

## 2015-01-29 VITALS — BP 124/60 | HR 76 | Wt 286.0 lb

## 2015-01-29 DIAGNOSIS — Z1389 Encounter for screening for other disorder: Secondary | ICD-10-CM

## 2015-01-29 DIAGNOSIS — O26893 Other specified pregnancy related conditions, third trimester: Secondary | ICD-10-CM

## 2015-01-29 DIAGNOSIS — Z331 Pregnant state, incidental: Secondary | ICD-10-CM

## 2015-01-29 DIAGNOSIS — N898 Other specified noninflammatory disorders of vagina: Secondary | ICD-10-CM | POA: Diagnosis not present

## 2015-01-29 LAB — POCT URINALYSIS DIPSTICK
Blood, UA: NEGATIVE
GLUCOSE UA: NEGATIVE
Ketones, UA: NEGATIVE
Leukocytes, UA: NEGATIVE
Nitrite, UA: NEGATIVE
PROTEIN UA: NEGATIVE

## 2015-01-29 LAB — POCT WET PREP (WET MOUNT): Clue Cells Wet Prep Whiff POC: NEGATIVE

## 2015-01-29 NOTE — Patient Instructions (Signed)
Call the office ([phone]) or go to [facility] if:  You begin to have strong, frequent contractions  Your water breaks.  Sometimes it is a big gush of fluid, sometimes it is just a trickle that keeps getting your panties wet or running down your legs  You have vaginal bleeding.  It is normal to have a small amount of spotting if your cervix was checked.   You don't feel your baby moving like normal.  If you don't, get you something to eat and drink and lay down and focus on feeling your baby move.  You should feel at least 10 movements in 2 hours.  If you don't, you should call the office or go to [facility].    Tdap Vaccine  It is recommended that you get the Tdap vaccine during the third trimester of EACH pregnancy to help protect your baby from getting pertussis (whooping cough)  27-36 weeks is the BEST time to do this so that you can pass the protection on to your baby. During pregnancy is better than after pregnancy, but if you are unable to get it during pregnancy it will be offered at the hospital.   You can get this vaccine at the health department or your family doctor  Everyone who will be around your baby should also be up-to-date on their vaccines. Adults (who are not pregnant) only need 1 dose of Tdap during adulthood.      Preterm Labor Information Preterm labor is when labor starts at less than 37 weeks of pregnancy. The normal length of a pregnancy is 39 to 41 weeks. CAUSES Often, there is no identifiable underlying cause as to why a woman goes into preterm labor. One of the most common known causes of preterm labor is infection. Infections of the uterus, cervix, vagina, amniotic sac, bladder, kidney, or even the lungs (pneumonia) can cause labor to start. Other suspected causes of preterm labor include:  8. Urogenital infections, such as yeast infections and bacterial vaginosis.  9. Uterine abnormalities (uterine shape, uterine septum, fibroids, or bleeding  from the placenta).  10. A cervix that has been operated on (it may fail to stay closed).  11. Malformations in the fetus.  12. Multiple gestations (twins, triplets, and so on).  13. Breakage of the amniotic sac.  RISK FACTORS 2. Having a previous history of preterm labor.  3. Having premature rupture of membranes (PROM).  4. Having a placenta that covers the opening of the cervix (placenta previa).  5. Having a placenta that separates from the uterus (placental abruption).  6. Having a cervix that is too weak to hold the fetus in the uterus (incompetent cervix).  7. Having too much fluid in the amniotic sac (polyhydramnios).  8. Taking illegal drugs or smoking while pregnant.  9. Not gaining enough weight while pregnant.  10. Being younger than 18 and older than 25 years old.  11. Having a low socioeconomic status.  12. Being African American. SYMPTOMS Signs and symptoms of preterm labor include:   Menstrual-like cramps, abdominal pain, or back pain.  Uterine contractions that are regular, as frequent as six in an hour, regardless of their intensity (may be mild or painful).  Contractions that start on the top of the uterus and spread down to the lower abdomen and back.   A sense of increased pelvic pressure.   A watery or bloody mucus discharge that comes from the vagina.  TREATMENT Depending on the length of the pregnancy and   other circumstances, your health care provider may suggest bed rest. If necessary, there are medicines that can be given to stop contractions and to mature the fetal lungs. If labor happens before 34 weeks of pregnancy, a prolonged hospital stay may be recommended. Treatment depends on the condition of both you and the fetus.  WHAT SHOULD YOU DO IF YOU THINK YOU ARE IN PRETERM LABOR? Call your health care provider right away. You will need to go to the hospital to get checked immediately. HOW CAN YOU PREVENT PRETERM LABOR IN FUTURE  PREGNANCIES? You should:   Stop smoking if you smoke.  Maintain healthy weight gain and avoid chemicals and drugs that are not necessary.  Be watchful for any type of infection.  Inform your health care provider if you have a known history of preterm labor. Document Released: 08/23/2003 Document Revised: 02/02/2013 Document Reviewed: 07/05/2012 [employer] Patient Information 2015 [employer]. This information is not intended to replace advice given to you by your health care provider. Make sure you discuss any questions you have with your health care provider.  

## 2015-01-29 NOTE — Telephone Encounter (Signed)
Pt states when she woke up today she had a watery d/c that has a sweet smell to it.  Pt reports good FM, no bleeding or cramping.  Advised pt to be here at 2pm this afternoon and she will either see Joellyn Haff or Dr. Emelda Fear.  Pt verbalized understanding.

## 2015-01-29 NOTE — Progress Notes (Signed)
Work-in Low-risk OB appointment [id number] [redacted]w[redacted]d Estimated Date of Delivery: 04/03/15 BP 124/60 mmHg  Pulse 76  Wt 286 lb (129.729 kg)  LMP 08/06/2014 (Approximate)  BP, weight, and urine reviewed.  Refer to obstetrical flow sheet for FH & FHR.  Reports good fm.  Denies regular uc's, vb, or uti s/s. Woke up this am and had watery sweet smelling d/c, no big gush, no trickling, none since this am. Denies abnormal d/c, itching/odor/irritation.  SSE: cx visually closed, no pooling- no change w/ valsalva, scant creamy white nonodorous d/c    Nitrazine & fern neg, wet prep neg Reviewed ptl s/s, fkc, pn2 results. Recommended Tdap at HD/PCP per CDC guidelines.  Plan:  Continue routine obstetrical care  F/U as schedueld for OB appointment

## 2015-02-08 ENCOUNTER — Encounter: Payer: Medicaid Other | Admitting: Advanced Practice Midwife

## 2015-02-14 ENCOUNTER — Encounter: Payer: Self-pay | Admitting: Obstetrics and Gynecology

## 2015-02-14 ENCOUNTER — Ambulatory Visit (INDEPENDENT_AMBULATORY_CARE_PROVIDER_SITE_OTHER): Payer: Medicaid Other | Admitting: Obstetrics and Gynecology

## 2015-02-14 VITALS — BP 118/60 | HR 86 | Wt 284.5 lb

## 2015-02-14 DIAGNOSIS — Z1389 Encounter for screening for other disorder: Secondary | ICD-10-CM

## 2015-02-14 DIAGNOSIS — Z331 Pregnant state, incidental: Secondary | ICD-10-CM

## 2015-02-14 DIAGNOSIS — Z3493 Encounter for supervision of normal pregnancy, unspecified, third trimester: Secondary | ICD-10-CM

## 2015-02-14 LAB — POCT URINALYSIS DIPSTICK
GLUCOSE UA: NEGATIVE
KETONES UA: NEGATIVE
Leukocytes, UA: NEGATIVE
Nitrite, UA: NEGATIVE
RBC UA: NEGATIVE

## 2015-02-14 NOTE — Progress Notes (Signed)
Pt denies any problems or concerns at this time.  

## 2015-02-14 NOTE — Progress Notes (Signed)
This chart was scribed for Tilda Burrow, MD by Jarvis Morgan, ED Scribe. This patient was seen in room 1 and the patient's care was started at 2:39 PM.  Patient ID: Angela Mcclure, female   DOB: [birth year], 24 y.o.   MRN: [id number]  [id number] [redacted]w[redacted]d Estimated Date of Delivery: 04/03/15  Blood pressure 118/60, pulse 86, weight 284 lb 8 oz (129.048 kg), last menstrual period 08/06/2014, not currently breastfeeding.   refer to the ob flow sheet for FH and FHR, also BP, Wt, Urine results: negative  Patient reports good fetal movement, denies any bleeding and no rupture of membranes symptoms or regular contractions. Patient complaints: no complaints. Still evaluating future BC plans  FH: 33 cm FHR: 145 Cervix: vertex Edema: none  Questions were answered. Discussed future BC options Assessment: supervision of LROB Plan:  Continued routine obstetrical care,2 weeks for LROB  I personally performed the services described in this documentation, which was SCRIBED in my presence. The recorded information has been reviewed and considered accurate. It has been edited as necessary during review. Tilda Burrow, MD

## 2015-02-28 ENCOUNTER — Ambulatory Visit: Payer: Medicaid Other | Admitting: Obstetrics and Gynecology

## 2015-03-13 ENCOUNTER — Encounter: Payer: Self-pay | Admitting: Obstetrics and Gynecology

## 2015-03-13 ENCOUNTER — Ambulatory Visit (INDEPENDENT_AMBULATORY_CARE_PROVIDER_SITE_OTHER): Payer: Medicaid Other | Admitting: Obstetrics and Gynecology

## 2015-03-13 VITALS — BP 118/60 | HR 80 | Wt 289.0 lb

## 2015-03-13 DIAGNOSIS — Z369 Encounter for antenatal screening, unspecified: Secondary | ICD-10-CM

## 2015-03-13 DIAGNOSIS — Z331 Pregnant state, incidental: Secondary | ICD-10-CM

## 2015-03-13 DIAGNOSIS — Z1389 Encounter for screening for other disorder: Secondary | ICD-10-CM

## 2015-03-13 DIAGNOSIS — Z3483 Encounter for supervision of other normal pregnancy, third trimester: Secondary | ICD-10-CM

## 2015-03-13 DIAGNOSIS — Z3A37 37 weeks gestation of pregnancy: Secondary | ICD-10-CM

## 2015-03-13 LAB — OB RESULTS CONSOLE GBS
STREP GROUP B AG: NEGATIVE
STREP GROUP B AG: POSITIVE
STREP GROUP B AG: POSITIVE

## 2015-03-13 LAB — POCT URINALYSIS DIPSTICK
Glucose, UA: NEGATIVE
KETONES UA: NEGATIVE
Leukocytes, UA: NEGATIVE
Nitrite, UA: NEGATIVE
RBC UA: NEGATIVE

## 2015-03-13 LAB — OB RESULTS CONSOLE GC/CHLAMYDIA
CHLAMYDIA, DNA PROBE: NEGATIVE
GC PROBE AMP, GENITAL: NEGATIVE

## 2015-03-13 NOTE — Progress Notes (Signed)
[id number] [redacted]w[redacted]d Estimated Date of Delivery: 04/03/15  Blood pressure 118/60, pulse 80, weight 289 lb (131.09 kg), last menstrual period 08/06/2014, not currently breastfeeding.   refer to the ob flow sheet for FH and FHR, also BP, Wt, Urine results:notable for neg protein  Patient reports   good fetal movement, denies any bleeding and no rupture of membranes symptoms or regular contractions. Patient complaints:none. GBS / GC completed. Questions were answered. Assessment:  Plan:  Continued routine obstetrical care,  F/u in 1  weeks for pnx

## 2015-03-13 NOTE — Progress Notes (Signed)
Pt denies any problems or concerns at this time.  

## 2015-03-15 LAB — GC/CHLAMYDIA PROBE AMP
Chlamydia trachomatis, NAA: NEGATIVE
Neisseria gonorrhoeae by PCR: NEGATIVE

## 2015-03-19 LAB — STREP GP B NAA+RFLX: STREP GP B NAA+RFLX: POSITIVE — AB

## 2015-03-19 LAB — STREP GP B SUSCEPTIBILITY

## 2015-03-20 ENCOUNTER — Encounter: Payer: Medicaid Other | Admitting: Advanced Practice Midwife

## 2015-03-21 ENCOUNTER — Ambulatory Visit (INDEPENDENT_AMBULATORY_CARE_PROVIDER_SITE_OTHER): Payer: Medicaid Other | Admitting: Advanced Practice Midwife

## 2015-03-21 ENCOUNTER — Encounter: Payer: Self-pay | Admitting: Advanced Practice Midwife

## 2015-03-21 VITALS — BP 120/72 | HR 84 | Wt 294.5 lb

## 2015-03-21 DIAGNOSIS — Z3483 Encounter for supervision of other normal pregnancy, third trimester: Secondary | ICD-10-CM

## 2015-03-21 DIAGNOSIS — Z1389 Encounter for screening for other disorder: Secondary | ICD-10-CM

## 2015-03-21 DIAGNOSIS — Z331 Pregnant state, incidental: Secondary | ICD-10-CM

## 2015-03-21 LAB — POCT URINALYSIS DIPSTICK
Blood, UA: NEGATIVE
GLUCOSE UA: NEGATIVE
KETONES UA: NEGATIVE
NITRITE UA: NEGATIVE
Protein, UA: NEGATIVE

## 2015-03-21 NOTE — Progress Notes (Signed)
[id number] [redacted]w[redacted]d Estimated Date of Delivery: 04/03/15  Blood pressure 120/72, pulse 84, weight 294 lb 8 oz (133.584 kg), last menstrual period 08/06/2014, not currently breastfeeding.   BP weight and urine results all reviewed and noted.  Please refer to the obstetrical flow sheet for the fundal height and fetal heart rate documentation:  Patient reports good fetal movement, denies any bleeding and no rupture of membranes symptoms or regular contractions. Patient is without complaints. All questions were answered.  Orders Placed This Encounter  Procedures  . POCT Urinalysis Dipstick    Plan:  Continued routine obstetrical care,   Return in about 1 week (around 03/28/2015) for LROB.

## 2015-03-28 ENCOUNTER — Encounter (HOSPITAL_COMMUNITY): Payer: Self-pay | Admitting: *Deleted

## 2015-03-28 ENCOUNTER — Ambulatory Visit (INDEPENDENT_AMBULATORY_CARE_PROVIDER_SITE_OTHER): Payer: Medicaid Other | Admitting: Women's Health

## 2015-03-28 ENCOUNTER — Encounter: Payer: Self-pay | Admitting: Women's Health

## 2015-03-28 ENCOUNTER — Inpatient Hospital Stay (HOSPITAL_COMMUNITY)
Admission: AD | Admit: 2015-03-28 | Discharge: 2015-03-30 | DRG: 775 | Disposition: A | Discharge status: Home / Self Care | Payer: Medicaid Other | Source: Ambulatory Visit | Attending: Obstetrics and Gynecology | Admitting: Obstetrics and Gynecology

## 2015-03-28 VITALS — BP 140/90 | HR 68 | Wt 293.0 lb

## 2015-03-28 DIAGNOSIS — R030 Elevated blood-pressure reading, without diagnosis of hypertension: Secondary | ICD-10-CM

## 2015-03-28 DIAGNOSIS — Z3A39 39 weeks gestation of pregnancy: Secondary | ICD-10-CM | POA: Diagnosis not present

## 2015-03-28 DIAGNOSIS — Z823 Family history of stroke: Secondary | ICD-10-CM | POA: Diagnosis not present

## 2015-03-28 DIAGNOSIS — O163 Unspecified maternal hypertension, third trimester: Secondary | ICD-10-CM

## 2015-03-28 DIAGNOSIS — O99214 Obesity complicating childbirth: Secondary | ICD-10-CM | POA: Diagnosis present

## 2015-03-28 DIAGNOSIS — O133 Gestational [pregnancy-induced] hypertension without significant proteinuria, third trimester: Secondary | ICD-10-CM

## 2015-03-28 DIAGNOSIS — Z833 Family history of diabetes mellitus: Secondary | ICD-10-CM

## 2015-03-28 DIAGNOSIS — O134 Gestational [pregnancy-induced] hypertension without significant proteinuria, complicating childbirth: Principal | ICD-10-CM | POA: Diagnosis present

## 2015-03-28 DIAGNOSIS — Z8249 Family history of ischemic heart disease and other diseases of the circulatory system: Secondary | ICD-10-CM | POA: Diagnosis not present

## 2015-03-28 DIAGNOSIS — Z331 Pregnant state, incidental: Secondary | ICD-10-CM

## 2015-03-28 DIAGNOSIS — Z3483 Encounter for supervision of other normal pregnancy, third trimester: Secondary | ICD-10-CM

## 2015-03-28 DIAGNOSIS — Z1389 Encounter for screening for other disorder: Secondary | ICD-10-CM

## 2015-03-28 DIAGNOSIS — IMO0001 Reserved for inherently not codable concepts without codable children: Secondary | ICD-10-CM

## 2015-03-28 LAB — URINALYSIS, ROUTINE W REFLEX MICROSCOPIC
Bilirubin Urine: NEGATIVE
Glucose, UA: NEGATIVE mg/dL
Hgb urine dipstick: NEGATIVE
Ketones, ur: 15 mg/dL — AB
LEUKOCYTES UA: NEGATIVE
NITRITE: NEGATIVE
PROTEIN: NEGATIVE mg/dL
UROBILINOGEN UA: 1 mg/dL (ref 0.0–1.0)
pH: 6 (ref 5.0–8.0)

## 2015-03-28 LAB — CBC WITH DIFFERENTIAL/PLATELET
BASOS ABS: 0 10*3/uL (ref 0.0–0.1)
BASOS PCT: 0 %
EOS ABS: 0 10*3/uL (ref 0.0–0.7)
Eosinophils Relative: 0 %
HEMATOCRIT: 33.6 % — AB (ref 36.0–46.0)
Hemoglobin: 10.7 g/dL — ABNORMAL LOW (ref 12.0–15.0)
Lymphocytes Relative: 25 %
Lymphs Abs: 2.2 10*3/uL (ref 0.7–4.0)
MCH: 23.7 pg — ABNORMAL LOW (ref 26.0–34.0)
MCHC: 31.8 g/dL (ref 30.0–36.0)
MCV: 74.3 fL — ABNORMAL LOW (ref 78.0–100.0)
MONO ABS: 0.3 10*3/uL (ref 0.1–1.0)
Monocytes Relative: 4 %
NEUTROS ABS: 6.2 10*3/uL (ref 1.7–7.7)
Neutrophils Relative %: 71 %
PLATELETS: 244 10*3/uL (ref 150–400)
RBC: 4.52 MIL/uL (ref 3.87–5.11)
RDW: 17.8 % — AB (ref 11.5–15.5)
WBC: 8.8 10*3/uL (ref 4.0–10.5)

## 2015-03-28 LAB — POCT URINALYSIS DIPSTICK
Blood, UA: NEGATIVE
GLUCOSE UA: NEGATIVE
Ketones, UA: NEGATIVE
LEUKOCYTES UA: NEGATIVE
NITRITE UA: NEGATIVE

## 2015-03-28 LAB — COMPREHENSIVE METABOLIC PANEL
ALT: 12 U/L — ABNORMAL LOW (ref 14–54)
ANION GAP: 7 (ref 5–15)
AST: 15 U/L (ref 15–41)
Albumin: 3.1 g/dL — ABNORMAL LOW (ref 3.5–5.0)
Alkaline Phosphatase: 67 U/L (ref 38–126)
BUN: 13 mg/dL (ref 6–20)
CHLORIDE: 106 mmol/L (ref 101–111)
CO2: 22 mmol/L (ref 22–32)
Calcium: 9.2 mg/dL (ref 8.9–10.3)
Creatinine, Ser: 0.68 mg/dL (ref 0.44–1.00)
Glucose, Bld: 85 mg/dL (ref 65–99)
Potassium: 3.7 mmol/L (ref 3.5–5.1)
SODIUM: 135 mmol/L (ref 135–145)
Total Bilirubin: 0.4 mg/dL (ref 0.3–1.2)
Total Protein: 7.4 g/dL (ref 6.5–8.1)

## 2015-03-28 LAB — PROTEIN / CREATININE RATIO, URINE
CREATININE, URINE: 167 mg/dL
Protein Creatinine Ratio: 0.11 mg/mg{Cre} (ref 0.00–0.15)
TOTAL PROTEIN, URINE: 19 mg/dL

## 2015-03-28 LAB — TYPE AND SCREEN
ABO/RH(D): A POS
Antibody Screen: NEGATIVE

## 2015-03-28 LAB — URIC ACID: Uric Acid, Serum: 5.1 mg/dL (ref 2.3–6.6)

## 2015-03-28 LAB — LACTATE DEHYDROGENASE: LDH: 96 U/L — AB (ref 98–192)

## 2015-03-28 MED ORDER — PENICILLIN G POTASSIUM 5000000 UNITS IJ SOLR
5.0000 10*6.[IU] | Freq: Once | INTRAVENOUS | Status: AC
  Administered 2015-03-29: 5 10*6.[IU] via INTRAVENOUS
  Filled 2015-03-28: qty 5

## 2015-03-28 MED ORDER — ACETAMINOPHEN 325 MG PO TABS: 650.0000 mg | ORAL_TABLET | ORAL | Status: DC | PRN

## 2015-03-28 MED ORDER — PENICILLIN G POTASSIUM 5000000 UNITS IJ SOLR
2.5000 10*6.[IU] | INTRAVENOUS | Status: DC
  Administered 2015-03-29: 2.5 10*6.[IU] via INTRAVENOUS
  Filled 2015-03-28 (×7): qty 2.5

## 2015-03-28 MED ORDER — CITRIC ACID-SODIUM CITRATE 334-500 MG/5ML PO SOLN: 30.0000 mL | ORAL | Status: DC | PRN

## 2015-03-28 MED ORDER — ONDANSETRON HCL 4 MG/2ML IJ SOLN: 4.0000 mg | Freq: Four times a day (QID) | INTRAMUSCULAR | Status: DC | PRN

## 2015-03-28 MED ORDER — OXYCODONE-ACETAMINOPHEN 5-325 MG PO TABS: 1.0000 | ORAL_TABLET | ORAL | Status: DC | PRN

## 2015-03-28 MED ORDER — OXYTOCIN 40 UNITS IN LACTATED RINGERS INFUSION - SIMPLE MED: 1.0000 m[IU]/min | INTRAVENOUS | Status: DC

## 2015-03-28 MED ORDER — OXYCODONE-ACETAMINOPHEN 5-325 MG PO TABS: 2.0000 | ORAL_TABLET | ORAL | Status: DC | PRN

## 2015-03-28 MED ORDER — TERBUTALINE SULFATE 1 MG/ML IJ SOLN: 0.2500 mg | Freq: Once | INTRAMUSCULAR | Status: DC | PRN

## 2015-03-28 MED ORDER — LACTATED RINGERS IV SOLN: 500.0000 mL | INTRAVENOUS | Status: DC | PRN

## 2015-03-28 MED ORDER — OXYTOCIN BOLUS FROM INFUSION
500.0000 mL | INTRAVENOUS | Status: DC
  Administered 2015-03-29: 500 mL via INTRAVENOUS

## 2015-03-28 MED ORDER — LIDOCAINE HCL (PF) 1 % IJ SOLN: 30.0000 mL | INTRAMUSCULAR | Status: DC | PRN

## 2015-03-28 MED ORDER — FLEET ENEMA 7-19 GM/118ML RE ENEM: 1.0000 | ENEMA | RECTAL | Status: DC | PRN

## 2015-03-28 MED ORDER — OXYTOCIN 40 UNITS IN LACTATED RINGERS INFUSION - SIMPLE MED
62.5000 mL/h | INTRAVENOUS | Status: DC
  Filled 2015-03-28: qty 1000

## 2015-03-28 MED ORDER — LACTATED RINGERS IV SOLN
INTRAVENOUS | Status: DC
  Administered 2015-03-28: 23:00:00 via INTRAVENOUS

## 2015-03-28 NOTE — Progress Notes (Signed)
Low-risk OB appointment [id number] 7757w1d Estimated Date of Delivery: 04/03/15 BP 144/74 mmHg  Pulse 68  Wt 293 lb (132.904 kg)  LMP 08/06/2014 (Approximate) recheck 140/90 BP, weight, and urine reviewed.  Refer to obstetrical flow sheet for FH & FHR.  Reports good fm.  Denies regular uc's, lof, vb, or uti s/s. No complaints. Denies ha, scotomata, ruq/epigastric pain, n/v.   SVE per request: 4/50/-2, bbow, ballotable unable to adequately tell presenting part, vtx by informal u/s. Requests membrane sweeping- discussed r/b- membranes swept 2+ BLE edema, DTRs 2+, no clonus H/O GTHN, 2 elevated bp's today, trace proteinuria Reviewed labor s/s, fkc, pre-e warning s/s. Plan:  To [facility] for further eval of bp's/pre-e labs- notified resident to expect her F/U in 2d for OB appointment if d/c'd from [facility]

## 2015-03-28 NOTE — [facility]U Provider Note (Addendum)
  History     CSN: [id number]  Arrival date and time: 03/28/15 [id number]   First Provider Initiated Contact with Patient 03/28/15 1850      Chief Complaint  Patient presents with  . Hypertension   HPI Angela Mcclure 24 y.o. [id number] @[redacted]w[redacted]d  presents to [facility]U after having high blood pressure in clinic today.  They wanted her to have labs and monitoring.  She notes good fetal movement.  She is having irregular contractions.   She denies vaginal bleeding, LOF, vision changes, headache, epigastric pain, swelling.  She had blood pressure in first pregnancy but none in the interim until today.   OB History    Gravida Para Term Preterm AB TAB SAB Ectopic Multiple Living   3 2 2       0 2      Past Medical History  Diagnosis Date  . Asthma     Past Surgical History  Procedure Laterality Date  . No past surgeries      Family History  Problem Relation Age of Onset  . Diabetes Other   . Hypertension Other   . Stroke Other     Social History  Substance Use Topics  . Smoking status: Never Smoker   . Smokeless tobacco: Never Used  . Alcohol Use: No    Allergies: No Known Allergies  Prescriptions prior to admission  Medication Sig Dispense Refill Last Dose  . Prenatal Vit-Fe Fumarate-FA (PRENATAL MULTIVITAMIN) TABS tablet Take 1 tablet by mouth daily. 30 tablet 11 03/27/2015 at Unknown time    ROS Pertinent ROS in HPI.  All other systems are negative.   Physical Exam   Blood pressure 133/72, pulse 78, temperature 98.2 F (36.8 C), temperature source Oral, resp. rate 18, last menstrual period 08/06/2014, not currently breastfeeding. Temp:  [98.2 F (36.8 C)] 98.2 F (36.8 C) (10/12 1832) Pulse Rate:  [68-83] 79 (10/12 2016) Resp:  [18] 18 (10/12 1832) BP: (126-155)/(59-90) 128/68 mmHg (10/12 2016) Weight:  [132.904 kg (293 lb)] 132.904 kg (293 lb) (10/12 1557)  Physical Exam  Constitutional: She is oriented to person, place, and time. She appears well-developed and  well-nourished. No distress.  HENT:  Head: Normocephalic and atraumatic.  Eyes: EOM are normal.  Neck: Normal range of motion.  Cardiovascular: Normal rate.   Respiratory: Breath sounds normal. No respiratory distress.  GI: Soft. There is no tenderness.  Musculoskeletal: Normal range of motion.  Neurological: She is alert and oriented to person, place, and time.  Skin: Skin is warm and dry.  Psychiatric: She has a normal mood and affect.  (JVF) Cervix 4 cm / 20%/-2 vertex, soft cervix. Fetal Tracing: Baseline:140 Variability:mod Accelerations: 15x15 Decelerations:none Toco:none   MAU Course  Procedures  MDM NST reactive.   PIH labs ordered.   Results reviewed with Dr. Emelda FearFerguson.  Pt has had multiple readings of elevated blood pressures.  MD will come to assess pt to determine plan.    Assessment and Plan  A: Elevated pressures in term pregnancy, meeting Gest HTN criteria.  P: Admit per MD.    Bertram DenverKaren E Teague Clark 03/28/2015, 7:14 PM

## 2015-03-28 NOTE — Patient Instructions (Addendum)
Call the office [phone]) or go to [facility] if:  You begin to have strong, frequent contractions  Your water breaks.  Sometimes it is a big gush of fluid, sometimes it is just a trickle that keeps getting your panties wet or running down your legs  You have vaginal bleeding.  It is normal to have a small amount of spotting if your cervix was checked.   You don't feel your baby moving like normal.  If you don't, get you something to eat and drink and lay down and focus on feeling your baby move.  You should feel at least 10 movements in 2 hours.  If you don't, you should call the office or go to [facility].    Call the office [phone]) or go to [facility] hospital for these signs of pre-eclampsia:  Severe headache that does not go away with Tylenol  Visual changes- seeing spots, double, blurred vision  Pain under your right breast or upper abdomen that does not go away with Tums or heartburn medicine  Nausea and/or vomiting  Severe swelling in your hands, feet, and face     Preeclampsia and Eclampsia Preeclampsia is a serious condition that develops only during pregnancy. It is also called toxemia of pregnancy. This condition causes high blood pressure along with other symptoms, such as swelling and headaches. These may develop as the condition gets worse. Preeclampsia may occur 20 weeks or later into your pregnancy.  Diagnosing and treating preeclampsia early is very important. If not treated early, it can cause serious problems for you and your baby. One problem it can lead to is eclampsia, which is a condition that causes muscle jerking or shaking (convulsions) in the mother. Delivering your baby is the best treatment for preeclampsia or eclampsia.  RISK FACTORS The cause of preeclampsia is not known. You may be more likely to develop preeclampsia if you have certain risk factors. These include:   Being pregnant for the first time.  Having preeclampsia in a past  pregnancy.  Having a family history of preeclampsia.  Having high blood pressure.  Being pregnant with twins or triplets.  Being 1 or older.  Being African American.  Having kidney disease or diabetes.  Having medical conditions such as lupus or blood diseases.  Being very overweight (obese). SIGNS AND SYMPTOMS  The earliest signs of preeclampsia are:  High blood pressure.  Increased protein in your urine. Your health care provider will check for this at every prenatal visit. Other symptoms that can develop include:   Severe headaches.  Sudden weight gain.  Swelling of your hands, face, legs, and feet.  Feeling sick to your stomach (nauseous) and throwing up (vomiting).  Vision problems (blurred or double vision).  Numbness in your face, arms, legs, and feet.  Dizziness.  Slurred speech.  Sensitivity to bright lights.  Abdominal pain. DIAGNOSIS  There are no screening tests for preeclampsia. Your health care provider will ask you about symptoms and check for signs of preeclampsia during your prenatal visits. You may also have tests, including:  Urine testing.  Blood testing.  Checking your baby's heart rate.  Checking the health of your baby and your placenta using images created with sound waves (ultrasound). TREATMENT  You can work out the best treatment approach together with your health care provider. It is very important to keep all prenatal appointments. If you have an increased risk of preeclampsia, you may need more frequent prenatal exams.  Your health care provider may  prescribe bed rest.  You may have to eat as little salt as possible.  You may need to take medicine to lower your blood pressure if the condition does not respond to more conservative measures.  You may need to stay in the hospital if your condition is severe. There, treatment will focus on controlling your blood pressure and fluid retention. You may also need to take medicine  to prevent seizures.  If the condition gets worse, your baby may need to be delivered early to protect you and the baby. You may have your labor started with medicine (be induced), or you may have a cesarean delivery.  Preeclampsia usually goes away after the baby is born. HOME CARE INSTRUCTIONS   Only take over-the-counter or prescription medicines as directed by your health care provider.  Lie on your left side while resting. This keeps pressure off your baby.  Elevate your feet while resting.  Get regular exercise. Ask your health care provider what type of exercise is safe for you.  Avoid caffeine and alcohol.  Do not smoke.  Drink 6-8 glasses of water every day.  Eat a balanced diet that is low in salt. Do not add salt to your food.  Avoid stressful situations as much as possible.  Get plenty of rest and sleep.  Keep all prenatal appointments and tests as scheduled. SEEK MEDICAL CARE IF:  You are gaining more weight than expected.  You have any headaches, abdominal pain, or nausea.  You are bruising more than usual.  You feel dizzy or light-headed. SEEK IMMEDIATE MEDICAL CARE IF:   You develop sudden or severe swelling anywhere in your body. This usually happens in the legs.  You gain 5 lb (2.3 kg) or more in a week.  You have a severe headache, dizziness, problems with your vision, or confusion.  You have severe abdominal pain.  You have lasting nausea or vomiting.  You have a seizure.  You have trouble moving any part of your body.  You develop numbness in your body.  You have trouble speaking.  You have any abnormal bleeding.  You develop a stiff neck.  You pass out. MAKE SURE YOU:   Understand these instructions.  Will watch your condition.  Will get help right away if you are not doing well or get worse.   This information is not intended to replace advice given to you by your health care provider. Make sure you discuss any questions you  have with your health care provider.   Document Released: 05/30/2000 Document Revised: 06/07/2013 Document Reviewed: 03/25/2013 Elsevier Interactive Patient Education 2016 Elsevier Inc.  [employer] of the uterus can occur throughout pregnancy. Contractions are not always a sign that you are in labor.  WHAT ARE BRAXTON HICKS CONTRACTIONS?  Contractions that occur before labor are called Braxton Hicks contractions, or false labor. Toward the end of pregnancy (32-34 weeks), these contractions can develop more often and may become more forceful. This is not true labor because these contractions do not result in opening (dilatation) and thinning of the cervix. They are sometimes difficult to tell apart from true labor because these contractions can be forceful and people have different pain tolerances. You should not feel embarrassed if you go to the hospital with false labor. Sometimes, the only way to tell if you are in true labor is for your health care provider to look for changes in the cervix. If there are no prenatal problems or other health problems  associated with the pregnancy, it is completely safe to be sent home with false labor and await the onset of true labor. HOW CAN YOU TELL THE DIFFERENCE BETWEEN TRUE AND FALSE LABOR? False Labor  The contractions of false labor are usually shorter and not as hard as those of true labor.   The contractions are usually irregular.   The contractions are often felt in the front of the lower abdomen and in the groin.   The contractions may go away when you walk around or change positions while lying down.   The contractions get weaker and are shorter lasting as time goes on.   The contractions do not usually become progressively stronger, regular, and closer together as with true labor.  True Labor  Contractions in true labor last 30-70 seconds, become very regular, usually become more intense, and increase in  frequency.   The contractions do not go away with walking.   The discomfort is usually felt in the top of the uterus and spreads to the lower abdomen and low back.   True labor can be determined by your health care provider with an exam. This will show that the cervix is dilating and getting thinner.  WHAT TO REMEMBER  Keep up with your usual exercises and follow other instructions given by your health care provider.   Take medicines as directed by your health care provider.   Keep your regular prenatal appointments.   Eat and drink lightly if you think you are going into labor.   If Braxton Hicks contractions are making you uncomfortable:   Change your position from lying down or resting to walking, or from walking to resting.   Sit and rest in a tub of warm water.   Drink 2-3 glasses of water. Dehydration may cause these contractions.   Do slow and deep breathing several times an hour.  WHEN SHOULD I SEEK IMMEDIATE MEDICAL CARE? Seek immediate medical care if:  Your contractions become stronger, more regular, and closer together.   You have fluid leaking or gushing from your vagina.   You have a fever.   You pass blood-tinged mucus.   You have vaginal bleeding.   You have continuous abdominal pain.   You have low back pain that you never had before.   You feel your baby's head pushing down and causing pelvic pressure.   Your baby is not moving as much as it used to.    This information is not intended to replace advice given to you by your health care provider. Make sure you discuss any questions you have with your health care provider.   Document Released: 06/02/2005 Document Revised: 06/07/2013 Document Reviewed: 03/14/2013 Elsevier Interactive Patient Education [employer]2016 Elsevier Inc.

## 2015-03-28 NOTE — MAU Note (Signed)
Pt seen @ MD office today, BP was elevated, pt instructed to come to MAU.  Pt denies HA, blurry vision, or epigastric pain.  Has intermittent uc's, no bleeding or LOF.

## 2015-03-29 ENCOUNTER — Inpatient Hospital Stay (HOSPITAL_COMMUNITY): Payer: Medicaid Other | Admitting: Anesthesiology

## 2015-03-29 ENCOUNTER — Encounter (HOSPITAL_COMMUNITY): Payer: Self-pay

## 2015-03-29 DIAGNOSIS — O134 Gestational [pregnancy-induced] hypertension without significant proteinuria, complicating childbirth: Secondary | ICD-10-CM

## 2015-03-29 DIAGNOSIS — Z3A39 39 weeks gestation of pregnancy: Secondary | ICD-10-CM

## 2015-03-29 LAB — CBC
HCT: 34.7 % — ABNORMAL LOW (ref 36.0–46.0)
HEMATOCRIT: 33.2 % — AB (ref 36.0–46.0)
HEMOGLOBIN: 10.4 g/dL — AB (ref 12.0–15.0)
Hemoglobin: 11.1 g/dL — ABNORMAL LOW (ref 12.0–15.0)
MCH: 23.1 pg — AB (ref 26.0–34.0)
MCH: 23.6 pg — AB (ref 26.0–34.0)
MCHC: 31.3 g/dL (ref 30.0–36.0)
MCHC: 32 g/dL (ref 30.0–36.0)
MCV: 73.8 fL — ABNORMAL LOW (ref 78.0–100.0)
MCV: 73.8 fL — AB (ref 78.0–100.0)
PLATELETS: 233 10*3/uL (ref 150–400)
Platelets: 224 10*3/uL (ref 150–400)
RBC: 4.5 MIL/uL (ref 3.87–5.11)
RBC: 4.7 MIL/uL (ref 3.87–5.11)
RDW: 17.9 % — ABNORMAL HIGH (ref 11.5–15.5)
RDW: 17.6 % — AB (ref 11.5–15.5)
WBC: 9.3 10*3/uL (ref 4.0–10.5)
WBC: 11.5 10*3/uL — AB (ref 4.0–10.5)

## 2015-03-29 LAB — RPR: RPR: NONREACTIVE

## 2015-03-29 MED ORDER — FENTANYL 2.5 MCG/ML BUPIVACAINE 1/10 % EPIDURAL INFUSION (WH - ANES)
14.0000 mL/h | INTRAMUSCULAR | Status: DC | PRN
  Administered 2015-03-29: 14 mL/h via EPIDURAL
  Filled 2015-03-29: qty 125

## 2015-03-29 MED ORDER — PHENYLEPHRINE 40 MCG/ML (10ML) SYRINGE FOR IV PUSH (FOR BLOOD PRESSURE SUPPORT)
80.0000 ug | PREFILLED_SYRINGE | INTRAVENOUS | Status: DC | PRN
  Filled 2015-03-29: qty 20

## 2015-03-29 MED ORDER — ZOLPIDEM TARTRATE 5 MG PO TABS
5.0000 mg | ORAL_TABLET | Freq: Once | ORAL | Status: AC
  Administered 2015-03-29: 5 mg via ORAL
  Filled 2015-03-29: qty 1

## 2015-03-29 MED ORDER — FENTANYL 2.5 MCG/ML BUPIVACAINE 1/10 % EPIDURAL INFUSION (WH - ANES): 14.0000 mL/h | INTRAMUSCULAR | Status: DC | PRN

## 2015-03-29 MED ORDER — TETANUS-DIPHTH-ACELL PERTUSSIS 5-2.5-18.5 LF-MCG/0.5 IM SUSP: 0.5000 mL | Freq: Once | INTRAMUSCULAR | Status: DC

## 2015-03-29 MED ORDER — ZOLPIDEM TARTRATE 5 MG PO TABS: 5.0000 mg | ORAL_TABLET | Freq: Every evening | ORAL | Status: DC | PRN

## 2015-03-29 MED ORDER — DIBUCAINE 1 % RE OINT: 1.0000 "application " | TOPICAL_OINTMENT | RECTAL | Status: DC | PRN

## 2015-03-29 MED ORDER — FENTANYL 2.5 MCG/ML BUPIVACAINE 1/10 % EPIDURAL INFUSION (WH - ANES)
INTRAMUSCULAR | Status: DC | PRN
  Administered 2015-03-29: 14 mL/h via EPIDURAL

## 2015-03-29 MED ORDER — IBUPROFEN 600 MG PO TABS
600.0000 mg | ORAL_TABLET | Freq: Four times a day (QID) | ORAL | Status: DC
  Filled 2015-03-29: qty 1

## 2015-03-29 MED ORDER — SENNOSIDES-DOCUSATE SODIUM 8.6-50 MG PO TABS
2.0000 | ORAL_TABLET | ORAL | Status: DC
  Administered 2015-03-29: 2 via ORAL
  Filled 2015-03-29: qty 2

## 2015-03-29 MED ORDER — LIDOCAINE HCL (PF) 1 % IJ SOLN
INTRAMUSCULAR | Status: DC | PRN
  Administered 2015-03-29 (×2): 5 mL

## 2015-03-29 MED ORDER — DIPHENHYDRAMINE HCL 50 MG/ML IJ SOLN: 12.5000 mg | INTRAMUSCULAR | Status: DC | PRN

## 2015-03-29 MED ORDER — LANOLIN HYDROUS EX OINT: TOPICAL_OINTMENT | CUTANEOUS | Status: DC | PRN

## 2015-03-29 MED ORDER — ONDANSETRON HCL 4 MG/2ML IJ SOLN: 4.0000 mg | INTRAMUSCULAR | Status: DC | PRN

## 2015-03-29 MED ORDER — IBUPROFEN 600 MG PO TABS: 600.0000 mg | ORAL_TABLET | Freq: Four times a day (QID) | ORAL | Status: DC

## 2015-03-29 MED ORDER — IBUPROFEN 600 MG PO TABS
600.0000 mg | ORAL_TABLET | Freq: Four times a day (QID) | ORAL | Status: DC | PRN
  Administered 2015-03-29 – 2015-03-30 (×2): 600 mg via ORAL
  Filled 2015-03-29 (×2): qty 1

## 2015-03-29 MED ORDER — WITCH HAZEL-GLYCERIN EX PADS: 1.0000 "application " | MEDICATED_PAD | CUTANEOUS | Status: DC | PRN

## 2015-03-29 MED ORDER — ONDANSETRON HCL 4 MG PO TABS: 4.0000 mg | ORAL_TABLET | ORAL | Status: DC | PRN

## 2015-03-29 MED ORDER — IBUPROFEN 600 MG PO TABS
600.0000 mg | ORAL_TABLET | Freq: Four times a day (QID) | ORAL | Status: DC
  Administered 2015-03-29: 600 mg via ORAL

## 2015-03-29 MED ORDER — PRENATAL MULTIVITAMIN CH
1.0000 | ORAL_TABLET | Freq: Every day | ORAL | Status: DC
  Administered 2015-03-29 – 2015-03-30 (×2): 1 via ORAL
  Filled 2015-03-29 (×2): qty 1

## 2015-03-29 MED ORDER — EPHEDRINE 5 MG/ML INJ: 10.0000 mg | INTRAVENOUS | Status: DC | PRN

## 2015-03-29 MED ORDER — ACETAMINOPHEN 325 MG PO TABS: 650.0000 mg | ORAL_TABLET | ORAL | Status: DC | PRN

## 2015-03-29 MED ORDER — BENZOCAINE-MENTHOL 20-0.5 % EX AERO
1.0000 "application " | INHALATION_SPRAY | CUTANEOUS | Status: DC | PRN
  Administered 2015-03-29: 1 via TOPICAL
  Filled 2015-03-29: qty 56

## 2015-03-29 MED ORDER — OXYCODONE-ACETAMINOPHEN 5-325 MG PO TABS: 1.0000 | ORAL_TABLET | ORAL | Status: DC | PRN

## 2015-03-29 MED ORDER — OXYCODONE-ACETAMINOPHEN 5-325 MG PO TABS: 2.0000 | ORAL_TABLET | ORAL | Status: DC | PRN

## 2015-03-29 MED ORDER — DIPHENHYDRAMINE HCL 25 MG PO CAPS
25.0000 mg | ORAL_CAPSULE | Freq: Four times a day (QID) | ORAL | Status: DC | PRN
Start: 2015-03-29 — End: 2015-03-30

## 2015-03-29 MED ORDER — SIMETHICONE 80 MG PO CHEW: 80.0000 mg | CHEWABLE_TABLET | ORAL | Status: DC | PRN

## 2015-03-29 NOTE — Anesthesia Procedure Notes (Signed)
Epidural Patient location during procedure: OB Start time: 03/29/2015 8:28 AM End time: 03/29/2015 8:48 AM  Staffing Anesthesiologist: Sebastian AcheMANNY, Jaysun Wessels  Preanesthetic Checklist Completed: patient identified, site marked, surgical consent, pre-op evaluation, timeout performed, IV checked, risks and benefits discussed and monitors and equipment checked  Epidural Patient position: sitting Prep: site prepped and draped and DuraPrep Patient monitoring: heart rate, continuous pulse ox and blood pressure Approach: midline Location: L3-L4 Injection technique: LOR air  Needle:  Needle type: Tuohy  Needle gauge: 17 G Needle length: 9 cm and 9 Needle insertion depth: 7 cm Catheter type: closed end flexible Catheter size: 19 Gauge Catheter at skin depth: 15 cm Test dose: negative  Assessment Events: blood not aspirated, injection not painful, no injection resistance, negative IV test and no paresthesia  Additional Notes   Patient tolerated the insertion well without complications.Reason for block:procedure for pain

## 2015-03-29 NOTE — Progress Notes (Signed)
Called because pt is requesting pain medication and FHR tracing having not been reactive since earlier in the evening. CNM offered ambien for sleep while in MAU and waiting for room. Asked CNM to review tracing and CNM happy with reassuring but not reactive tracing.

## 2015-03-29 NOTE — Anesthesia Preprocedure Evaluation (Signed)
Anesthesia Evaluation  Patient identified by MRN, date of birth, ID band Patient awake and Patient confused    Reviewed: Allergy & Precautions, H&P , NPO status , Patient's Chart, lab work & pertinent test results  Airway Mallampati: II   Neck ROM: full    Dental   Pulmonary    Pulmonary exam normal breath sounds clear to auscultation       Cardiovascular Exercise Tolerance: Good Normal cardiovascular exam Rhythm:regular Rate:Normal     Neuro/Psych    GI/Hepatic   Endo/Other  Morbid obesity  Renal/GU      Musculoskeletal   Abdominal   Peds  Hematology   Anesthesia Other Findings   Reproductive/Obstetrics (+) Pregnancy PIH                             Anesthesia Physical Anesthesia Plan  ASA: III  Anesthesia Plan:    Post-op Pain Management:    Induction:   Airway Management Planned:   Additional Equipment:   Intra-op Plan:   Post-operative Plan:   Informed Consent: I have reviewed the patients History and Physical, chart, labs and discussed the procedure including the risks, benefits and alternatives for the proposed anesthesia with the patient or authorized representative who has indicated his/her understanding and acceptance.     Plan Discussed with:   Anesthesia Plan Comments:         Anesthesia Quick Evaluation

## 2015-03-29 NOTE — MAU Note (Signed)
Dr. Emelda FearFerguson on unit to review BP and FHR tracing

## 2015-03-29 NOTE — Progress Notes (Signed)
Repeat CBC done for epidural removal; plts WNL and anesthesia aware of removal. Catheter removed and intact.

## 2015-03-29 NOTE — Progress Notes (Signed)
Blood pressures are back to baseline normal. Will rx ibuprofen for postpartum cramps.

## 2015-03-29 NOTE — Lactation Note (Signed)
This note was copied from the chart of Angela Mcclure Somoza. Lactation Consultation Note Initial visit at 11 hours of age.  Mom reports a good feeding earlier, but then she didn't know if baby was getting enough so she gave baby a bottle of formula.  Encouraged mom to offer baby breast first.  Mom had success with breastfeeding for 3 months each of her 2 older children that also got bottles of formula.  Mom has been hand expressing and hand pumping, but not seeing colostrum.  Encouraged mom that baby can empty breast best, mom feels encouraged.  [facility] resources given and discussed.  Encouraged to feed with early cues on demand.  Early newborn behavior discussed. Baby asleep in crib and lights out in room, mom to call Rn for assist with latch and hand expression when she is ready.   Mom to call for assist as needed.    Patient Name: Angela Mcclure Isenberg ZOXWR'UToday's Date: 03/29/2015 Reason for consult: Initial assessment   Maternal Data Has patient been taught Hand Expression?: Yes Does the patient have breastfeeding experience prior to this delivery?: Yes  Feeding Feeding Type: Formula Nipple Type: Slow - flow  LATCH Score/Interventions                Intervention(s): Breastfeeding basics reviewed     Lactation Tools Discussed/Used     Consult Status Consult Status: Follow-up Date: 03/30/15 Follow-up type: In-patient    Jannifer RodneyShoptaw, Gamal Todisco Lynn 03/29/2015, 11:13 PM

## 2015-03-30 ENCOUNTER — Encounter: Payer: Medicaid Other | Admitting: Obstetrics and Gynecology

## 2015-03-30 NOTE — Discharge Summary (Signed)
OB Discharge Summary     Patient Name: Angela Mcclure DOB: 1991-06-11 MRN: [id number]  Date of admission: 03/28/2015 Delivering MD: Randel Books   Date of discharge: 03/30/2015  Admitting diagnosis: 37WKS HIGH BLOOD PRESSURE Intrauterine pregnancy: [redacted]w[redacted]d     Secondary diagnosis: Gestational Hypertension     Discharge diagnosis: Term Pregnancy Delivered                                                                                                Post partum procedures:none  Augmentation: Pitocin  Complications: None  Hospital course:  Induction of Labor With Vaginal Delivery   24 y.o. yo [phone] at [redacted]w[redacted]d was admitted to the hospital 03/28/2015 for induction of labor.  Indication for induction: Gestational hypertension.  Patient had an uncomplicated labor course as follows: Membrane Rupture Time/Date: 11:11 AM ,03/29/2015   Intrapartum Procedures: Episiotomy: None [1]                                         Lacerations:  None [1]  Patient had delivery of a Viable infant.  Information for the patient's newborn:  Tyffani, Foglesong [[id number]]  Delivery Method: Vag-Spont   03/29/2015  Details of delivery can be found in separate delivery note.  Patient had a routine postpartum course. Patient is discharged home No discharge date for patient encounter.    Physical exam  Filed Vitals:   03/29/15 1415 03/29/15 1533 03/29/15 1851 03/30/15 0553  BP: 133/58 127/84 129/73 138/57  Pulse: 67 71 82 66  Temp: 99 F (37.2 C) 98.1 F (36.7 C) 98.2 F (36.8 C) 98 F (36.7 C)  TempSrc: Axillary Oral Oral Oral  Resp: 98 Height:      SpO2:   100%    General: alert, cooperative and no distress Lochia: appropriate Uterine Fundus: firm Incision: N/A DVT Evaluation: No evidence of DVT seen on physical exam. Negative Homan's sign. No significant calf/ankle edema. Labs: Lab Results  Component Value Date   WBC 11.5* 03/29/2015   HGB 11.1* 03/29/2015   HCT 34.7*  03/29/2015   MCV 73.8* 03/29/2015   PLT 233 03/29/2015   CMP Latest Ref Rng 03/28/2015  Glucose 65 - 99 mg/dL 85  BUN 6 - 20 mg/dL 13  Creatinine 1.30 - 8.65 mg/dL 7.84  Sodium 696 - 295 mmol/L 135  Potassium 3.5 - 5.1 mmol/L 3.7  Chloride 101 - 111 mmol/L 106  CO2 22 - 32 mmol/L 22  Calcium 8.9 - 10.3 mg/dL 9.2  Total Protein 6.5 - 8.1 g/dL 7.4  Total Bilirubin 0.3 - 1.2 mg/dL 0.4  Alkaline Phos 38 - 126 U/L 67  AST 15 - 41 U/L 15  ALT 14 - 54 U/L 12(L)    Discharge instruction: per After Visit Summary and "Baby and Me Booklet".  Medications:  Current facility-administered medications:  .  acetaminophen (TYLENOL) tablet 650 mg, 650 mg, Oral, Q4H PRN, Casey Burkitt, MD .  benzocaine-Menthol (DERMOPLAST) 20-0.5 % topical  spray 1 application, 1 application, Topical, PRN, Casey BurkittHillary Moen Fitzgerald, MD, 1 application at 03/29/15 1753 .  witch hazel-glycerin (TUCKS) pad 1 application, 1 application, Topical, PRN **AND** dibucaine (NUPERCAINAL) 1 % rectal ointment 1 application, 1 application, Rectal, PRN, Casey BurkittHillary Moen Fitzgerald, MD .  diphenhydrAMINE (BENADRYL) capsule 25 mg, 25 mg, Oral, Q6H PRN, Casey BurkittHillary Moen Fitzgerald, MD .  ibuprofen (ADVIL,MOTRIN) tablet 600 mg, 600 mg, Oral, Q6H PRN, Jacklyn ShellFrances Cresenzo-Dishmon, CNM, 600 mg at 03/30/15 0538 .  lanolin ointment, , Topical, PRN, Casey BurkittHillary Moen Fitzgerald, MD .  ondansetron [facility](ZOFRAN) tablet 4 mg, 4 mg, Oral, Q4H PRN **OR** ondansetron (ZOFRAN) injection 4 mg, 4 mg, Intravenous, Q4H PRN, Casey BurkittHillary Moen Fitzgerald, MD .  oxyCODONE-acetaminophen (PERCOCET/ROXICET) 5-325 MG per tablet 1 tablet, 1 tablet, Oral, Q4H PRN, Casey BurkittHillary Moen Fitzgerald, MD .  oxyCODONE-acetaminophen (PERCOCET/ROXICET) 5-325 MG per tablet 2 tablet, 2 tablet, Oral, Q4H PRN, Casey BurkittHillary Moen Fitzgerald, MD .  prenatal multivitamin tablet 1 tablet, 1 tablet, Oral, Q1200, Casey BurkittHillary Moen Fitzgerald, MD, 1 tablet at 03/29/15 1754 .  senna-docusate (Senokot-S) tablet 2 tablet,  2 tablet, Oral, Q24H, Casey BurkittHillary Moen Fitzgerald, MD, 2 tablet at 03/29/15 2347 .  simethicone (MYLICON) chewable tablet 80 mg, 80 mg, Oral, PRN, Casey BurkittHillary Moen Fitzgerald, MD .  Tdap (BOOSTRIX) injection 0.5 mL, 0.5 mL, Intramuscular, Once, Casey BurkittHillary Moen Fitzgerald, MD .  zolpidem [facility](AMBIEN) tablet 5 mg, 5 mg, Oral, QHS PRN, Casey BurkittHillary Moen Fitzgerald, MD  Diet: routine diet  Activity: Advance as tolerated. Pelvic rest for 6 weeks.   Outpatient follow up:6 weeks  Postpartum contraception: Condoms  Newborn Data: Live born female  Birth Weight: 7 lb 10.8 oz (3480 g) APGAR: 8, 9  Baby Feeding: Bottle and Breast Disposition:home with mother   03/30/2015 Mickie HillierIan McKeag, MD   I have seen and examined this patient and agree the above assessment.  Respiratory effort normal, lochia appropriate, legs negative,  pain level normal.  CRESENZO-DISHMAN,Charlsey Moragne 04/03/2015 8:56 AM

## 2015-03-30 NOTE — Anesthesia Postprocedure Evaluation (Signed)
  Anesthesia Post-op Note  Patient: Angela Mcclure  Procedure(s) Performed: * No procedures listed *  Patient Location: Mother/Baby  Anesthesia Type:Epidural  Level of Consciousness: awake  Airway and Oxygen Therapy: Patient Spontanous Breathing  Post-op Pain: mild  Post-op Assessment: Patient's Cardiovascular Status Stable and Respiratory Function Stable              Post-op Vital Signs: stable  Last Vitals:  Filed Vitals:   03/30/15 0553  BP: 138/57  Pulse: 66  Temp: 36.7 C  Resp: 20    Complications: No apparent anesthesia complications

## 2015-03-30 NOTE — Discharge Instructions (Signed)
Postpartum Hypertension  Postpartum hypertension is high blood pressure after pregnancy that remains higher than normal for more than two days after delivery. You may not realize that you have postpartum hypertension if your blood pressure is not being checked regularly. In some cases, postpartum hypertension will go away on its own, usually within a week of delivery. However, for some women, medical treatment is required to prevent serious complications, such as seizures or stroke.  The following things can affect your blood pressure:  · The type of delivery you had.  · Having received IV fluids or other medicines during or after delivery.  CAUSES   Postpartum hypertension may be caused by any of the following or by a combination of any of the following:  · Hypertension that existed before pregnancy (chronic hypertension).  · Gestational hypertension.  · Preeclampsia or eclampsia.  · Receiving a lot of fluid through an IV during or after delivery.  · Medicines.  · HELLP syndrome.  · Hyperthyroidism.  · Stroke.  · Other rare neurological or blood disorders.  In some cases, the cause may not be known.  RISK FACTORS  Postpartum hypertension can be related to one or more risk factors, such as:  · Chronic hypertension. In some cases, this may not have been diagnosed before pregnancy.  · Obesity.  · Type 2 diabetes.  · Kidney disease.  · Family history of preeclampsia.  · Other medical conditions that cause hormonal imbalances.  SIGNS AND SYMPTOMS  As with all types of hypertension, postpartum hypertension may not have any symptoms. Depending on how high your blood pressure is, you may experience:  · Headaches. These may be mild, moderate, or severe. They may also be steady, constant, or sudden in onset (thunderclap headache).  · Visual changes.  · Dizziness.  · Shortness of breath.  · Swelling of your hands, feet, lower legs, or face. In some cases, you may have swelling in more than one of these locations.  · Heart  palpitations or a racing heartbeat.  · Difficulty breathing while lying down.  · Decreased urination.  Other rare signs and symptoms may include:  · Sweating more than usual. This lasts longer than a few days after delivery.  · Chest pain.  · Sudden dizziness when you get up from sitting or lying down.  · Seizures.  · Nausea or vomiting.  · Abdominal pain.  DIAGNOSIS  The diagnosis of postpartum hypertension is made through a combination of physical examination findings and testing of your blood and urine. You may also have additional tests, such as a CT scan or an MRI, to check for other complications of postpartum hypertension.  TREATMENT  When blood pressure is high enough to require treatment, your options may include:  · Medicines to reduce blood pressure (antihypertensives). Tell your health care provider if you are breastfeeding or if you plan to breastfeed. There are many antihypertensive medicines that are safe to take while breastfeeding.  · Stopping medicines that may be causing hypertension.  · Treating medical conditions that are causing hypertension.  · Treating the complications of hypertension, such as seizures, stroke, or kidney problems.  Your health care provider will also continue to monitor your blood pressure closely and repeatedly until it is within a safe range for you.   HOME CARE INSTRUCTIONS  · Take medicines only as directed by your health care provider.  · Get regular exercise after your health care provider tells you that it is safe.  · Follow   your health care provider's recommendations on fluid and salt restrictions.  · Do not use any tobacco products, including cigarettes, chewing tobacco, or electronic cigarettes. If you need help quitting, ask your health care provider.  · Keep all follow-up visits as directed by your health care provider. This is important.  SEEK MEDICAL CARE IF:  · Your symptoms get worse.  · You have new symptoms, such as:    Headache.    Dizziness.    Visual  changes.  SEEK IMMEDIATE MEDICAL CARE IF:  · You develop a severe or sudden headache.  · You have seizures.  · You develop numbness or weakness on one side of your body.  · You have difficulty thinking, speaking, or swallowing.  · You develop severe abdominal pain.  · You develop difficulty breathing, chest pain, a racing heartbeat, or heart palpitations.  These symptoms may represent a serious problem that is an emergency. Do not wait to see if the symptoms will go away. Get medical help right away. Call your local emergency services (911 in the [place] Do not drive yourself to the hospital.     This information is not intended to replace advice given to you by your health care provider. Make sure you discuss any questions you have with your health care provider.     Document Released: 02/03/2014 Document Reviewed: 02/03/2014  Elsevier Interactive Patient Education ©2016 [employer]

## 2015-03-30 NOTE — Progress Notes (Signed)
UR chart review completed.  

## 2015-03-30 NOTE — Lactation Note (Signed)
This note was copied from the chart of Angela Mcclure Footman. Lactation Consultation Note  Mother has been primarily formula feeding but states she wants to pump. She has manual pump.  Discussed supply and demand and the importance of establishing her milk supply. Encouraged her to either pump or breastfeed before giving formula. Reviewed engorgement care and monitoring voids/stools.   Patient Name: Angela Mcclure Calica WGNFA'OToday's Date: 03/30/2015 Reason for consult: Follow-up assessment   Maternal Data    Feeding    LATCH Score/Interventions                      Lactation Tools Discussed/Used     Consult Status Consult Status: Complete    Hardie PulleyBerkelhammer, Darina Hartwell Boschen 03/30/2015, 8:30 AM

## 2015-03-30 NOTE — Addendum Note (Signed)
Addendum  created 03/30/15 0935 by Renford DillsJanet L Kem Parcher, CRNA   Modules edited: Charges VN, Notes Section   Notes Section:  File: [id number]

## 2015-04-01 NOTE — H&P (Signed)
Angela BurrowJohn Cameryn Chrisley Mcclure, Angela Mcclure Physician Addendum Obstetrics [facility] Provider Note 03/28/2015 7:14 PM    Expand All Collapse All    History    Admitted on 10/12 per this evaluation -- jvf CSN: [id number]  Arrival date and time: 03/28/15 [id number]  First Provider Initiated Contact with Patient 03/28/15 1850    Chief Complaint  Patient presents with  . Hypertension   HPI Angela Mcclure 24 y.o. [id number] @[redacted]w[redacted]d  presents to [facility] after having high blood pressure in clinic today. They wanted her to have labs and monitoring. She notes good fetal movement. She is having irregular contractions. She denies vaginal bleeding, LOF, vision changes, headache, epigastric pain, swelling. She had blood pressure in first pregnancy but none in the interim until today.  OB History    Gravida Para Term Preterm AB TAB SAB Ectopic Multiple Living   3 2 2       0 2      Past Medical History  Diagnosis Date  . Asthma     Past Surgical History  Procedure Laterality Date  . No past surgeries      Family History  Problem Relation Age of Onset  . Diabetes Other   . Hypertension Other   . Stroke Other     Social History  Substance Use Topics  . Smoking status: Never Smoker   . Smokeless tobacco: Never Used  . Alcohol Use: No    Allergies: No Known Allergies  Prescriptions prior to admission  Medication Sig Dispense Refill Last Dose  . Prenatal Vit-Fe Fumarate-FA (PRENATAL MULTIVITAMIN) TABS tablet Take 1 tablet by mouth daily. 30 tablet 11 03/27/2015 at Unknown time    ROS Pertinent ROS in HPI. All other systems are negative.   Physical Exam   Blood pressure 133/72, pulse 78, temperature 98.2 F (36.8 C), temperature source Oral, resp. rate 18, last menstrual period 08/06/2014, not currently breastfeeding. Temp: [98.2 F (36.8 C)] 98.2 F (36.8 C) (10/12 1832) Pulse Rate: [68-83] 79 (10/12  2016) Resp: [18] 18 (10/12 1832) BP: (126-155)/(59-90) 128/68 mmHg (10/12 2016) Weight: [132.904 kg (293 lb)] 132.904 kg (293 lb) (10/12 1557)  Physical Exam  Constitutional: She is oriented to person, place, and time. She appears well-developed and well-nourished. No distress.  HENT:  Head: Normocephalic and atraumatic.  Eyes: EOM are normal.  Neck: Normal range of motion.  Cardiovascular: Normal rate.  Respiratory: Breath sounds normal. No respiratory distress.  GI: Soft. There is no tenderness.  Musculoskeletal: Normal range of motion.  Neurological: She is alert and oriented to person, place, and time.  Skin: Skin is warm and dry.  Psychiatric: She has a normal mood and affect.  (JVF) Cervix 4 cm / 20%/-2 vertex, soft cervix. Fetal Tracing: Baseline:140 Variability:mod Accelerations: 15x15 Decelerations:none Toco:none   MAU Course  Procedures  MDM NST reactive.  PIH labs ordered.  Results reviewed with Dr. Emelda FearFerguson. Pt has had multiple readings of elevated blood pressures. Angela Mcclure will come to assess pt to determine plan.   Assessment and Plan  A: Elevated pressures in term pregnancy, meeting Gest HTN criteria.  P: Admit per Angela Mcclure.   Bertram DenverKaren E Teague Clark 03/28/2015, 7:14 PM                  Angela BurrowFERGUSON,Angela Howton Mcclure, Angela Mcclure

## 2015-04-02 ENCOUNTER — Telehealth: Payer: Self-pay | Admitting: Obstetrics & Gynecology

## 2015-04-02 MED ORDER — IBUPROFEN 600 MG PO TABS: 600.0000 mg | ORAL_TABLET | Freq: Four times a day (QID) | ORAL | Status: DC | PRN

## 2015-04-02 NOTE — Telephone Encounter (Signed)
Pt requesting Ibuprofen, delivered 03/29/2015 SVD.

## 2015-04-04 NOTE — Telephone Encounter (Signed)
Pt informed, she states she had stopped the PNV but will start back on them.

## 2015-04-04 NOTE — Telephone Encounter (Signed)
[facility] patients hemoglobin was 10.5 at her visit today.

## 2015-05-14 ENCOUNTER — Encounter: Payer: Self-pay | Admitting: Women's Health

## 2015-05-14 ENCOUNTER — Ambulatory Visit (INDEPENDENT_AMBULATORY_CARE_PROVIDER_SITE_OTHER): Payer: Medicaid Other | Admitting: Women's Health

## 2015-05-14 DIAGNOSIS — O165 Unspecified maternal hypertension, complicating the puerperium: Secondary | ICD-10-CM | POA: Insufficient documentation

## 2015-05-14 DIAGNOSIS — Z30018 Encounter for initial prescription of other contraceptives: Secondary | ICD-10-CM

## 2015-05-14 HISTORY — DX: Unspecified maternal hypertension, complicating the puerperium: O16.5

## 2015-05-14 MED ORDER — ETONOGESTREL-ETHINYL ESTRADIOL 0.12-0.015 MG/24HR VA RING: VAGINAL_RING | VAGINAL | Status: DC

## 2015-05-14 MED ORDER — AMLODIPINE BESYLATE 5 MG PO TABS: 5.0000 mg | ORAL_TABLET | Freq: Every day | ORAL | Status: DC

## 2015-05-14 NOTE — Progress Notes (Signed)
Patient ID: Angela DittyAngela N Mcclure, female   DOB: [birth year], 24 y.o.   MRN: [id number] Subjective:    Angela Mcclure is a 24 y.o. [id number] African American female who presents for a postpartum visit. She is 6 weeks postpartum following a spontaneous vaginal delivery at 39.2 gestational weeks after IOL d/t GHTN. Anesthesia: epidural. I have fully reviewed the prenatal and intrapartum course. Postpartum course has been uncomplicated. Baby's course has been uncomplicated. Baby is feeding by bottle. Bleeding no bleeding. Bowel function is normal. Bladder function is normal. Patient is sexually active. Last sexual activity: Sat, used condom. Contraception method is wants nuva ring. Does not smoke, no h/o DVT/PE, CVA, MI, or migraines w/ aura. Postpartum depression screening: negative. Score 0.  Last pap 11/08/14 and was neg.  The following portions of the patient's history were reviewed and updated as appropriate: allergies, current medications, past medical history, past surgical history and problem list.  Review of Systems Pertinent items are noted in HPI.   Filed Vitals:   05/14/15 1445  BP: 140/84  Pulse: 68  Height: 5\' 5"  (1.651 m)  Weight: 270 lb (122.471 kg)   No LMP recorded.  Objective:   General:  alert, cooperative and no distress   Breasts:  deferred, no complaints  Lungs: clear to auscultation bilaterally  Heart:  regular rate and rhythm  Abdomen: soft, nontender   Vulva: normal  Vagina: normal vagina  Cervix:  closed  Corpus: Well-involuted  Adnexa:  Non-palpable  Rectal Exam: No hemorrhoids        Assessment:   Postpartum exam 6 wks s/p SVB after IOL for GHTN PP HTN Bottlefeeding Depression screening Contraception counseling   Plan:   Contraception: NuvaRing vaginal inserts  Rx norvasc 5mg  daily, stop 2d prior to next appt Follow up in: 4 weeks for bp check or earlier if needed  Marge DuncansBooker, Abeer Deskins Randall CNM, [facility]NP-BC 05/14/2015 3:04 PM

## 2015-05-14 NOTE — Patient Instructions (Signed)
Stop amlodipine (norvasc) 2 days before your next appointment  Ethinyl Estradiol; Etonogestrel vaginal ring What is this medicine? ETHINYL ESTRADIOL; ETONOGESTREL (ETH in il es tra DYE ole; et oh noe JES trel) vaginal ring is a flexible, vaginal ring used as a contraceptive (birth control method). This medicine combines two types of female hormones, an estrogen and a progestin. This ring is used to prevent ovulation and pregnancy. Each ring is effective for one month. This medicine may be used for other purposes; ask your health care provider or pharmacist if you have questions. What should I tell my health care provider before I take this medicine? They need to know if you have or ever had any of these conditions: -abnormal vaginal bleeding -blood vessel disease or blood clots -breast, cervical, endometrial, ovarian, liver, or uterine cancer -diabetes -gallbladder disease -heart disease or recent heart attack -high blood pressure -high cholesterol -kidney disease -liver disease -migraine headaches -stroke -systemic lupus erythematosus (SLE) -tobacco smoker -an unusual or allergic reaction to estrogens, progestins, other medicines, foods, dyes, or preservatives -pregnant or trying to get pregnant -breast-feeding How should I use this medicine? Insert the ring into your vagina as directed. Follow the directions on the prescription label. The ring will remain place for 3 weeks and is then removed for a 1-week break. A new ring is inserted 1 week after the last ring was removed, on the same day of the week. Do not use more often than directed. A patient package insert for the product will be given with each prescription and refill. Read this sheet carefully each time. The sheet may change frequently. Contact your pediatrician regarding the use of this medicine in children. Special care may be needed. This medicine has been used in female children who have started having menstrual  periods. Overdosage: If you think you have taken too much of this medicine contact a poison control center or emergency room at once. NOTE: This medicine is only for you. Do not share this medicine with others. What if I miss a dose? You will need to replace your vaginal ring once a month as directed. If the ring should slip out, or if you leave it in longer or shorter than you should, contact your health care professional for advice. What may interact with this medicine? -acetaminophen -antibiotics or medicines for infections, especially rifampin, rifabutin, rifapentine, and griseofulvin, and possibly penicillins or tetracyclines -aprepitant -ascorbic acid (vitamin C) -atorvastatin -barbiturate medicines, such as phenobarbital -bosentan -carbamazepine -caffeine -clofibrate -cyclosporine -dantrolene -doxercalciferol -felbamate -grapefruit juice -hydrocortisone -medicines for anxiety or sleeping problems, such as diazepam or temazepam -medicines for diabetes, including pioglitazone -modafinil -mycophenolate -nefazodone -oxcarbazepine -phenytoin -prednisolone -ritonavir or other medicines for HIV infection or AIDS -rosuvastatin -selegiline -soy isoflavones supplements -St. John's wort -tamoxifen or raloxifene -theophylline -thyroid hormones -topiramate -warfarin This list may not describe all possible interactions. Give your health care provider a list of all the medicines, herbs, non-prescription drugs, or dietary supplements you use. Also tell them if you smoke, drink alcohol, or use illegal drugs. Some items may interact with your medicine. What should I watch for while using this medicine? Visit your doctor or health care professional for regular checks on your progress. You will need a regular breast and pelvic exam and Pap smear while on this medicine. Use an additional method of contraception during the first cycle that you use this ring. If you have any reason to  think you are pregnant, stop using this medicine right away and contact your  doctor or health care professional. If you are using this medicine for hormone related problems, it may take several cycles of use to see improvement in your condition. Smoking increases the risk of getting a blood clot or having a stroke while you are using hormonal birth control, especially if you are more than 25 years old. You are strongly advised not to smoke. This medicine can make your body retain fluid, making your fingers, hands, or ankles swell. Your blood pressure can go up. Contact your doctor or health care professional if you feel you are retaining fluid. This medicine can make you more sensitive to the sun. Keep out of the sun. If you cannot avoid being in the sun, wear protective clothing and use sunscreen. Do not use sun lamps or tanning beds/booths. If you wear contact lenses and notice visual changes, or if the lenses begin to feel uncomfortable, consult your eye care specialist. In some women, tenderness, swelling, or minor bleeding of the gums may occur. Notify your dentist if this happens. Brushing and flossing your teeth regularly may help limit this. See your dentist regularly and inform your dentist of the medicines you are taking. If you are going to have elective surgery, you may need to stop using this medicine before the surgery. Consult your health care professional for advice. This medicine does not protect you against HIV infection (AIDS) or any other sexually transmitted diseases. What side effects may I notice from receiving this medicine? Side effects that you should report to your doctor or health care professional as soon as possible: -breast tissue changes or discharge -changes in vaginal bleeding during your period or between your periods -chest pain -coughing up blood -dizziness or fainting spells -headaches or migraines -leg, arm or groin pain -severe or sudden headaches -stomach  pain (severe) -sudden shortness of breath -sudden loss of coordination, especially on one side of the body -speech problems -symptoms of vaginal infection like itching, irritation or unusual discharge -tenderness in the upper abdomen -vomiting -weakness or numbness in the arms or legs, especially on one side of the body -yellowing of the eyes or skin Side effects that usually do not require medical attention (report to your doctor or health care professional if they continue or are bothersome): -breakthrough bleeding and spotting that continues beyond the 3 initial cycles of pills -breast tenderness -mood changes, anxiety, depression, frustration, anger, or emotional outbursts -increased sensitivity to sun or ultraviolet light -nausea -skin rash, acne, or brown spots on the skin -weight gain (slight) This list may not describe all possible side effects. Call your doctor for medical advice about side effects. You may report side effects to FDA at 1-800-FDA-1088. Where should I keep my medicine? Keep out of the reach of children. Store at room temperature between 15 and 30 degrees C (59 and 86 degrees F) for up to 4 months. The product will expire after 4 months. Protect from light. Throw away any unused medicine after the expiration date. NOTE: This sheet is a summary. It may not cover all possible information. If you have questions about this medicine, talk to your doctor, pharmacist, or health care provider.    2016, Elsevier/[employer]. (2008-05-18 12:03:58)

## 2015-06-12 ENCOUNTER — Ambulatory Visit: Payer: Medicaid Other | Admitting: Women's Health

## 2015-06-13 ENCOUNTER — Ambulatory Visit: Payer: Medicaid Other | Admitting: Women's Health

## 2015-06-13 ENCOUNTER — Encounter: Payer: Self-pay | Admitting: Women's Health

## 2016-02-10 ENCOUNTER — Emergency Department (HOSPITAL_COMMUNITY): Payer: Medicaid Other

## 2016-02-10 ENCOUNTER — Encounter (HOSPITAL_COMMUNITY): Payer: Self-pay | Admitting: Emergency Medicine

## 2016-02-10 ENCOUNTER — Emergency Department (HOSPITAL_COMMUNITY)
Admission: EM | Admit: 2016-02-10 | Discharge: 2016-02-11 | Disposition: A | Discharge status: Home / Self Care | Payer: Medicaid Other | Attending: Emergency Medicine | Admitting: Emergency Medicine

## 2016-02-10 DIAGNOSIS — R1032 Left lower quadrant pain: Secondary | ICD-10-CM | POA: Insufficient documentation

## 2016-02-10 DIAGNOSIS — K5900 Constipation, unspecified: Secondary | ICD-10-CM | POA: Diagnosis not present

## 2016-02-10 DIAGNOSIS — R300 Dysuria: Secondary | ICD-10-CM | POA: Diagnosis not present

## 2016-02-10 DIAGNOSIS — R1030 Lower abdominal pain, unspecified: Secondary | ICD-10-CM

## 2016-02-10 DIAGNOSIS — J45909 Unspecified asthma, uncomplicated: Secondary | ICD-10-CM | POA: Diagnosis not present

## 2016-02-10 DIAGNOSIS — R1031 Right lower quadrant pain: Secondary | ICD-10-CM | POA: Insufficient documentation

## 2016-02-10 DIAGNOSIS — R110 Nausea: Secondary | ICD-10-CM | POA: Diagnosis not present

## 2016-02-10 LAB — URINALYSIS, ROUTINE W REFLEX MICROSCOPIC
BILIRUBIN URINE: NEGATIVE
Glucose, UA: NEGATIVE mg/dL
Hgb urine dipstick: NEGATIVE
Ketones, ur: NEGATIVE mg/dL
LEUKOCYTES UA: NEGATIVE
NITRITE: NEGATIVE
Protein, ur: NEGATIVE mg/dL
SPECIFIC GRAVITY, URINE: 1.01 (ref 1.005–1.030)
pH: 7 (ref 5.0–8.0)

## 2016-02-10 LAB — POC URINE PREG, ED: PREG TEST UR: NEGATIVE

## 2016-02-10 NOTE — ED Provider Notes (Cosign Needed)
[facility] Provider Note   CSN: [id number] Arrival date & time: 02/10/16  2104  By signing my name below, I, Phillis Haggis, attest that this documentation has been prepared under the direction and in the presence of Ivery Quale, PA-C. Electronically Signed: Phillis Haggis, ED Scribe. 02/10/16. 9:52 PM.   History   Chief Complaint Chief Complaint  Patient presents with  . Abdominal Pain   The history is provided by the patient. No language interpreter was used.  Abdominal Pain   This is a new problem. The current episode started 1 to 2 hours ago. The problem occurs constantly. The problem has been gradually worsening. The pain is located in the suprapubic region. The pain is moderate. Associated symptoms include nausea, constipation and dysuria. Pertinent negatives include fever, diarrhea and vomiting.   HPI Comments: Angela Mcclure is a 25 y.o. female who presents to the Emergency Department complaining of sudden onset, suprapubic abdominal tenderness onset one hour ago. Pt reports associated nausea, constipation, back pain, and dysuria. Pt states that she had a BM PTA, but she had difficulty passing it. She has not tried anything for her symptoms. She denies suspicious food intake, sick contacts, fever, chills, diarrhea, or vomiting. LNMP 01/30/16.  Past Medical History:  Diagnosis Date  . Asthma     Patient Active Problem List   Diagnosis Date Noted  . 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 Obesity 10/18/2013  . History of gestational hypertension 10/18/2013  . Asthma 08/18/2012    Past Surgical History:  Procedure Laterality Date  . NO PAST SURGERIES      OB History    Gravida Para Term Preterm AB Living   3 3 3     3    SAB TAB Ectopic Multiple Live Births         0 3       Home Medications    Prior to Admission medications   Medication Sig Start Date End Date Taking? Authorizing Provider  amLODipine (NORVASC) 5 MG tablet Take 1 tablet (5 mg total) by  mouth daily. 05/14/15   Cheral Marker, CNM  etonogestrel-ethinyl estradiol (NUVARING) 0.12-0.015 MG/24HR vaginal ring Insert vaginally and leave in place for 3 consecutive weeks, then remove for 1 week. 05/14/15   Cheral Marker, CNM    Family History Family History  Problem Relation Age of Onset  . Diabetes Other   . Hypertension Other   . Stroke Other     Social History Social History  Substance Use Topics  . Smoking status: Never Smoker  . Smokeless tobacco: Never Used  . Alcohol use No     Allergies   Review of patient's allergies indicates no known allergies.   Review of Systems Review of Systems  Constitutional: Negative for chills and fever.  Gastrointestinal: Positive for abdominal pain, constipation and nausea. Negative for diarrhea and vomiting.  Genitourinary: Positive for dysuria.  All other systems reviewed and are negative.    Physical Exam Updated Vital Signs BP 137/72 (BP Location: Left Arm)   Pulse 74   Temp 98 F (36.7 C) (Oral)   Resp 18   Ht 5\' 5"  (1.651 m)   Wt 250 lb (113.4 kg)   LMP 01/30/2016 (Exact Date)   SpO2 99%   BMI 41.60 kg/m   Physical Exam  Constitutional: She is oriented to person, place, and time. She appears well-developed and well-nourished.  HENT:  Head: Normocephalic and atraumatic.  Eyes: Conjunctivae and EOM are normal. Pupils are equal,  round, and reactive to light.  Neck: Normal range of motion.  Cardiovascular: Normal rate and regular rhythm.   Pulmonary/Chest: Effort normal and breath sounds normal.  Abdominal: Soft. Bowel sounds are normal. She exhibits no distension. There is tenderness in the right lower quadrant, suprapubic area and left lower quadrant.  RLQ, LLQ, suprapubic tenderness to palpation  Musculoskeletal: Normal range of motion. She exhibits no edema.  Neurological: She is alert and oriented to person, place, and time.  Skin: Skin is warm and dry. Capillary refill takes less than 2 seconds.  Upper and lower extremities.  Psychiatric: She has a normal mood and affect. Her behavior is normal.  Nursing note and vitals reviewed.    ED Treatments / Results  DIAGNOSTIC STUDIES: Oxygen Saturation is 99% on RA, normal by my interpretation.    COORDINATION OF CARE: 9:51 PM-Discussed treatment plan which includes labs with pt at bedside and pt agreed to plan.    Labs (all labs ordered are listed, but only abnormal results are displayed) Labs Reviewed  LIPASE, BLOOD  COMPREHENSIVE METABOLIC PANEL  CBC  URINALYSIS, ROUTINE W REFLEX MICROSCOPIC (NOT [facility])  PREGNANCY, URINE    EKG  EKG Interpretation None       Radiology No results found.  Procedures Procedures (including critical care time)  Medications Ordered in ED Medications - No data to display   Initial Impression / Assessment and Plan / ED Course  I have reviewed the triage vital signs and the nursing notes.  Pertinent labs & imaging results that were available during my care of the patient were reviewed by me and considered in my medical decision making (see chart for details).  Clinical Course    *I have reviewed nursing notes, vital signs, and all appropriate lab and imaging results for this patient.**  Final Clinical Impressions(s) / ED Diagnoses  Vital signs within normal limits. Urinalysis is negative for acute event. Urine pregnancy is negative. CBC wnl.  Non-contrasted CT is negative for acute changes. Pt is to return to the ED if any changes or problem.   Final diagnoses:  None  **I personally performed the services described in this documentation, which was scribed in my presence. The recorded information has been reviewed and is accurate.*  New Prescriptions New Prescriptions   No medications on file  Dictation #1 [id number]  [id number]  CSN:[id number]    Ivery QualeHobson Shakerra Red, PA-C 02/12/16 1642    Ivery QualeHobson Meera Vasco, PA-C 03/01/16 1534

## 2016-02-10 NOTE — ED Triage Notes (Signed)
Pt reports suprapubic/lower abdominal pain that started approx 1 hour ago. Pt c/o nausea.

## 2016-02-11 ENCOUNTER — Emergency Department (HOSPITAL_COMMUNITY): Payer: Medicaid Other

## 2016-02-11 LAB — CBC WITH DIFFERENTIAL/PLATELET
BASOS ABS: 0 10*3/uL (ref 0.0–0.1)
Basophils Relative: 0 %
EOS PCT: 0 %
Eosinophils Absolute: 0 10*3/uL (ref 0.0–0.7)
HCT: 36.3 % (ref 36.0–46.0)
HEMOGLOBIN: 11.1 g/dL — AB (ref 12.0–15.0)
LYMPHS PCT: 22 %
Lymphs Abs: 2.3 10*3/uL (ref 0.7–4.0)
MCH: 23.4 pg — ABNORMAL LOW (ref 26.0–34.0)
MCHC: 30.6 g/dL (ref 30.0–36.0)
MCV: 76.4 fL — AB (ref 78.0–100.0)
Monocytes Absolute: 0.4 10*3/uL (ref 0.1–1.0)
Monocytes Relative: 4 %
NEUTROS PCT: 74 %
Neutro Abs: 7.4 10*3/uL (ref 1.7–7.7)
PLATELETS: 268 10*3/uL (ref 150–400)
RBC: 4.75 MIL/uL (ref 3.87–5.11)
RDW: 16.7 % — ABNORMAL HIGH (ref 11.5–15.5)
WBC: 10.1 10*3/uL (ref 4.0–10.5)

## 2016-02-11 LAB — COMPREHENSIVE METABOLIC PANEL
ALT: 18 U/L (ref 14–54)
ANION GAP: 9 (ref 5–15)
AST: 16 U/L (ref 15–41)
Albumin: 4.2 g/dL (ref 3.5–5.0)
Alkaline Phosphatase: 51 U/L (ref 38–126)
BUN: 14 mg/dL (ref 6–20)
CHLORIDE: 101 mmol/L (ref 101–111)
CO2: 27 mmol/L (ref 22–32)
CREATININE: 0.87 mg/dL (ref 0.44–1.00)
Calcium: 9.9 mg/dL (ref 8.9–10.3)
Glucose, Bld: 89 mg/dL (ref 65–99)
POTASSIUM: 3.9 mmol/L (ref 3.5–5.1)
SODIUM: 137 mmol/L (ref 135–145)
Total Bilirubin: 0.5 mg/dL (ref 0.3–1.2)
Total Protein: 8 g/dL (ref 6.5–8.1)

## 2016-02-11 LAB — LIPASE, BLOOD: LIPASE: 17 U/L (ref 11–51)

## 2016-02-11 MED ORDER — TRAMADOL HCL 50 MG PO TABS
50.0000 mg | ORAL_TABLET | Freq: Once | ORAL | Status: AC
  Administered 2016-02-11: 50 mg via ORAL
  Filled 2016-02-11: qty 1

## 2016-02-11 MED ORDER — KETOROLAC TROMETHAMINE 10 MG PO TABS
10.0000 mg | ORAL_TABLET | Freq: Once | ORAL | Status: AC
  Administered 2016-02-11: 10 mg via ORAL
  Filled 2016-02-11: qty 1

## 2016-02-11 MED ORDER — PROMETHAZINE HCL 12.5 MG PO TABS
12.5000 mg | ORAL_TABLET | Freq: Once | ORAL | Status: AC
  Administered 2016-02-11: 12.5 mg via ORAL
  Filled 2016-02-11: qty 1

## 2016-02-11 NOTE — ED Notes (Signed)
Pt returned from xray,  

## 2016-02-11 NOTE — Discharge Instructions (Signed)
Your urine pregnancy test is negative. Your labs are negative for acute problems. Your CT scan is negative for acute problems. Please see your Medicaid access physician for additional workup concerning your lower abdomen pain. Please return to the emergency department if any high fever, excessive nausea, vomiting, blood in urine, or changes in your general condition.

## 2016-03-27 NOTE — ED Provider Notes (Signed)
Medical screening examination/treatment/procedure(s) were performed by non-physician practitioner and as supervising physician I was immediately available for consultation/collaboration.   EKG Interpretation None         Samuel JesterKathleen Willem Klingensmith, DO 03/27/16 1803

## 2016-04-28 NOTE — ED Provider Notes (Signed)
ED Provider Notes Cosign Needed  Date of Service: 02/10/2016 9:18 PM Ivery Quale, PA-C  Emergency Medicine  Merged Document Preview     Expand All Collapse All   [] Hide copied text [] Hover for attribution information  [facility] Provider Note   CSN: [id number] Arrival date & time: 02/10/16  2104  By signing my name below, I, Phillis Haggis, attest that this documentation has been prepared under the direction and in the presence of Ivery Quale, PA-C. Electronically Signed: Phillis Haggis, ED Scribe. 02/10/16. 9:52 PM.   History              Chief Complaint    Chief Complaint  Patient presents with  . Abdominal Pain   The history is provided by the patient. No language interpreter was used.  Abdominal Pain   This is a new problem. The current episode started 1 to 2 hours ago. The problem occurs constantly. The problem has been gradually worsening. The pain is located in the suprapubic region. The pain is moderate. Associated symptoms include nausea, constipation and dysuria. Pertinent negatives include fever, diarrhea and vomiting.   HPI Comments: Angela Mcclure is a 25 y.o. female who presents to the Emergency Department complaining of sudden onset, suprapubic abdominal tenderness onset one hour ago. Pt reports associated nausea, constipation, back pain, and dysuria. Pt states that she had a BM PTA, but she had difficulty passing it. She has not tried anything for her symptoms. She denies suspicious food intake, sick contacts, fever, chills, diarrhea, or vomiting. LNMP 01/30/16.      Past Medical History:  Diagnosis Date  . Asthma         Patient Active Problem List   Diagnosis Date Noted  . 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 Obesity 10/18/2013  . History of gestational hypertension 10/18/2013  . Asthma 08/18/2012         Past Surgical History:  Procedure Laterality Date  . NO PAST SURGERIES              OB History    Gravida  Para Term Preterm AB Living   3 3 3     3    SAB TAB Ectopic Multiple Live Births         0 3       Home Medications                      Prior to Admission medications   Medication Sig Start Date End Date Taking? Authorizing Provider  amLODipine (NORVASC) 5 MG tablet Take 1 tablet (5 mg total) by mouth daily. 05/14/15   Cheral Marker, CNM  etonogestrel-ethinyl estradiol (NUVARING) 0.12-0.015 MG/24HR vaginal ring Insert vaginally and leave in place for 3 consecutive weeks, then remove for 1 week. 05/14/15   Cheral Marker, CNM    Family History      Family History  Problem Relation Age of Onset  . Diabetes Other   . Hypertension Other   . Stroke Other     Social History     Social History  Substance Use Topics  . Smoking status: Never Smoker  . Smokeless tobacco: Never Used  . Alcohol use No     Allergies           Review of patient's allergies indicates no known allergies.   Review of Systems Review of Systems  Constitutional: Negative for chills and fever.  Gastrointestinal: Positive for abdominal pain, constipation and nausea. Negative for diarrhea  and vomiting.  Genitourinary: Positive for dysuria.  All other systems reviewed and are negative.    Physical Exam Updated Vital Signs BP 137/72 (BP Location: Left Arm)   Pulse 74   Temp 98 F (36.7 C) (Oral)   Resp 18   Ht 5\' 5"  (1.651 m)   Wt 250 lb (113.4 kg)   LMP 01/30/2016 (Exact Date)   SpO2 99%   BMI 41.60 kg/m   Physical Exam  Constitutional: She is oriented to person, place, and time. She appears well-developed and well-nourished.  HENT:  Head: Normocephalic and atraumatic.  Eyes: Conjunctivae and EOM are normal. Pupils are equal, round, and reactive to light.  Neck: Normal range of motion.  Cardiovascular: Normal rate and regular rhythm.   Pulmonary/Chest: Effort normal and breath sounds normal.  Abdominal: Soft. Bowel sounds are normal. She exhibits no  distension. There is tenderness in the right lower quadrant, suprapubic area and left lower quadrant.  RLQ, LLQ, suprapubic tenderness to palpation  Musculoskeletal: Normal range of motion. She exhibits no edema.  Neurological: She is alert and oriented to person, place, and time.  Skin: Skin is warm and dry. Capillary refill takes less than 2 seconds. Upper and lower extremities.  Psychiatric: She has a normal mood and affect. Her behavior is normal.  Nursing note and vitals reviewed.    ED Treatments / Results  DIAGNOSTIC STUDIES: Oxygen Saturation is 99% on RA, normal by my interpretation.    COORDINATION OF CARE: 9:51 PM-Discussed treatment plan which includes labs with pt at bedside and pt agreed to plan.    Labs (all labs ordered are listed, but only abnormal results are displayed) Labs Reviewed  LIPASE, BLOOD  COMPREHENSIVE METABOLIC PANEL  CBC  URINALYSIS, ROUTINE W REFLEX MICROSCOPIC (NOT AT [facility])  PREGNANCY, URINE    EKG      EKG Interpretation None      Radiology ImagingResults(Last48hours)  No results found.    Procedures Procedures (including critical care time)  Medications Ordered in ED Medications - No data to display   Initial Impression / Assessment and Plan / ED Course  I have reviewed the triage vital signs and the nursing notes.  Pertinent labs & imaging results that were available during my care of the patient were reviewed by me and considered in my medical decision making (see chart for details).  Clinical Course    *I have reviewed nursing notes, vital signs, and all appropriate lab and imaging results for this patient.**  Final Clinical Impressions(s) / ED Diagnoses  Vital signs within normal limits. Urinalysis is negative for acute event. Urine pregnancy is negative. CBC wnl.  Non-contrasted CT is negative for acute changes. Pt is to return to the ED if any changes or problem.   Final diagnoses:  None    **I personally performed the services described in this documentation, which was scribed in my presence. The recorded information has been reviewed and is accurate.*  New Prescriptions    New Prescriptions   No medications on file  Dictation #1 [id number]  [id number]  CSN:[id number]    Ivery QualeHobson Khylie Larmore, Cordelia Poche-C 02/12/16 1642    Ivery QualeHobson Kendric Sindelar, PA-C 03/01/16 1534     Electronically signed by Ivery QualeHobson Gertrude Tarbet, PA-C at 02/12/2016 4:42 PM Electronically signed by Ivery QualeHobson Tanaysia Bhardwaj, PA-C at 03/01/2016 3:34 PM     ED on 02/10/2016        Revision History        Detailed Report        Ivery QualeHobson Quindarrius Joplin,  PA-C 04/28/16 1746    Samuel JesterKathleen McManus, DO 04/30/16 1046

## 2016-05-27 ENCOUNTER — Encounter (HOSPITAL_COMMUNITY): Payer: Self-pay | Admitting: Emergency Medicine

## 2016-05-27 ENCOUNTER — Emergency Department (HOSPITAL_COMMUNITY)
Admission: EM | Admit: 2016-05-27 | Discharge: 2016-05-27 | Disposition: A | Discharge status: Home / Self Care | Payer: Medicaid Other | Attending: Emergency Medicine | Admitting: Emergency Medicine

## 2016-05-27 DIAGNOSIS — R519 Headache, unspecified: Secondary | ICD-10-CM

## 2016-05-27 DIAGNOSIS — R079 Chest pain, unspecified: Secondary | ICD-10-CM | POA: Insufficient documentation

## 2016-05-27 DIAGNOSIS — R51 Headache: Secondary | ICD-10-CM | POA: Insufficient documentation

## 2016-05-27 DIAGNOSIS — J45909 Unspecified asthma, uncomplicated: Secondary | ICD-10-CM | POA: Insufficient documentation

## 2016-05-27 DIAGNOSIS — R110 Nausea: Secondary | ICD-10-CM | POA: Insufficient documentation

## 2016-05-27 MED ORDER — DIPHENHYDRAMINE HCL 50 MG/ML IJ SOLN
25.0000 mg | Freq: Once | INTRAMUSCULAR | Status: AC
  Administered 2016-05-27: 25 mg via INTRAVENOUS
  Filled 2016-05-27: qty 1

## 2016-05-27 MED ORDER — SODIUM CHLORIDE 0.9 % IV BOLUS (SEPSIS)
1000.0000 mL | Freq: Once | INTRAVENOUS | Status: AC
  Administered 2016-05-27: 1000 mL via INTRAVENOUS

## 2016-05-27 MED ORDER — KETOROLAC TROMETHAMINE 30 MG/ML IJ SOLN
15.0000 mg | Freq: Once | INTRAMUSCULAR | Status: AC
Start: 2016-05-27 — End: 2016-05-27
  Administered 2016-05-27: 15 mg via INTRAVENOUS
  Filled 2016-05-27: qty 1

## 2016-05-27 MED ORDER — PROCHLORPERAZINE EDISYLATE 5 MG/ML IJ SOLN
10.0000 mg | Freq: Once | INTRAMUSCULAR | Status: AC
  Administered 2016-05-27: 10 mg via INTRAVENOUS
  Filled 2016-05-27: qty 2

## 2016-05-27 NOTE — ED Notes (Signed)
Gave patient warm blanket as requested.  

## 2016-05-27 NOTE — ED Notes (Signed)
Please note that I did not "remove" any epidural catheter

## 2016-05-27 NOTE — ED Provider Notes (Signed)
[facility] Provider Note   CSN: [id number] Arrival date & time: 05/27/16  1132  By signing my name below, I, Angela Mcclure, attest that this documentation has been prepared under the direction and in the presence of Raeford RazorStephen Miller [place], MD  Electronically Signed: Clovis PuAvnee Mcclure, ED Scribe. 05/27/16. 12:17 PM.   History   Chief Complaint Chief Complaint  Patient presents with  . Headache   The history is provided by the patient. No language interpreter was used.   HPI Comments:  Angela Mcclure is a 25 y.o. female who presents to the Emergency Department complaining of sudden onset, constant diffuse headaches x 4 days. She notes her headache radiates behind bilateral eyes. Pt also endorses sudden onset, stabbing chest pain with associated nausea which lasted for a couple of seconds that resided yesterday. She has taken Advil with no relief. Pt denies photophobia, visual changes, numbness, tingling, SOB, diaphoresis, neck pain, vomiting, any major medical problems, any other associated symptoms and modifying factors at this time.   Past Medical History:  Diagnosis Date  . Asthma     Patient Active Problem List   Diagnosis Date Noted  . 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 Obesity 10/18/2013  . History of gestational hypertension 10/18/2013  . Asthma 08/18/2012    Past Surgical History:  Procedure Laterality Date  . NO PAST SURGERIES      OB History    Gravida Para Term Preterm AB Living   3 3 3     3    SAB TAB Ectopic Multiple Live Births         0 3       Home Medications    Prior to Admission medications   Medication Sig Start Date End Date Taking? Authorizing Provider  amLODipine (NORVASC) 5 MG tablet Take 1 tablet (5 mg total) by mouth daily. Patient not taking: Reported on 05/27/2016 05/14/15   Cheral MarkerKimberly R Booker, CNM  etonogestrel-ethinyl estradiol (NUVARING) 0.12-0.015 MG/24HR vaginal ring Insert vaginally and leave in place for 3 consecutive weeks, then  remove for 1 week. 05/14/15   Cheral MarkerKimberly R Booker, CNM    Family History Family History  Problem Relation Age of Onset  . Diabetes Other   . Hypertension Other   . Stroke Other     Social History Social History  Substance Use Topics  . Smoking status: Never Smoker  . Smokeless tobacco: Never Used  . Alcohol use No     Allergies   Patient has no known allergies.   Review of Systems Review of Systems  Constitutional: Negative for diaphoresis.  Eyes: Negative for photophobia and visual disturbance.  Respiratory: Negative for shortness of breath.   Cardiovascular: Positive for chest pain.  Gastrointestinal: Positive for nausea. Negative for vomiting.  Musculoskeletal: Negative for neck pain.  Neurological: Positive for headaches. Negative for numbness.  All other systems reviewed and are negative.  Physical Exam Updated Vital Signs BP 146/61 (BP Location: Left Arm)   Pulse 70   Temp 98.3 F (36.8 C) (Oral)   Resp 19   Ht 5\' 5"  (1.651 m)   Wt 240 lb (108.9 kg)   LMP 05/24/2016   SpO2 100%   BMI 39.94 kg/m   Physical Exam  Constitutional: She is oriented to person, place, and time. She appears well-developed and well-nourished. No distress.  HENT:  Head: Normocephalic and atraumatic.  Eyes: Conjunctivae are normal.  Cardiovascular: Normal rate.   Pulmonary/Chest: Effort normal.  Abdominal: She exhibits no distension.  Neurological: She  is alert and oriented to person, place, and time. She displays normal reflexes. No cranial nerve deficit or sensory deficit. She exhibits normal muscle tone. Coordination normal.  Good finger nose bilaterally. No nuchal rigidity. Normal neuro exam   Skin: Skin is warm and dry.  Psychiatric: She has a normal mood and affect.  Nursing note and vitals reviewed.  ED Treatments / Results  DIAGNOSTIC STUDIES:  Oxygen Saturation is 100% on RA, normal by my interpretation.    COORDINATION OF CARE:  12:16 PM Discussed treatment plan  with pt at bedside and pt agreed to plan.  Labs (all labs ordered are listed, but only abnormal results are displayed) Labs Reviewed - No data to display  EKG  EKG Interpretation None       Radiology No results found.  Procedures Procedures (including critical care time)  Medications Ordered in ED Medications - No data to display   Initial Impression / Assessment and Plan / ED Course  I have reviewed the triage vital signs and the nursing notes.  Pertinent labs & imaging results that were available during my care of the patient were reviewed by me and considered in my medical decision making (see chart for details).  Clinical Course     25yF with headache. Suspect primary HA. Consider emergent secondary causes such as bleed, infectious or mass but doubt. There is no history of trauma. Pt has a nonfocal neurological exam. Afebrile and neck supple. No use of blood thinning medication. Consider ocular etiology such as acute angle closure glaucoma but doubt. Pt denies acute change in visual acuity and eye exam unremarkable. Doubt temporal arteritis given age, no temporal tenderness and temporal artery pulsations palpable. Doubt CO poisoning. No contacts with similar symptoms. Doubt venous thrombosis. Doubt carotid or vertebral arteries dissection. Symptoms improved with meds. Feel that can be safely discharged, but strict return precautions discussed. Outpt fu.   Final Clinical Impressions(s) / ED Diagnoses   Final diagnoses:  Nonintractable headache, unspecified chronicity pattern, unspecified headache type    New Prescriptions New Prescriptions   No medications on file    I personally preformed the services scribed in my presence. The recorded information has been reviewed is accurate. Raeford RazorStephen Hiba Garry, MD.     Raeford RazorStephen Jordynne Mccown, MD 06/03/16 [phone]

## 2016-05-27 NOTE — ED Triage Notes (Signed)
Pt reports headache x 4 days. Pt states she took one dose of advil last night. Pt has prior hx of headache.

## 2016-07-24 ENCOUNTER — Emergency Department (HOSPITAL_COMMUNITY)
Admission: EM | Admit: 2016-07-24 | Discharge: 2016-07-25 | Disposition: A | Discharge status: Home / Self Care | Payer: Medicaid Other | Attending: Emergency Medicine | Admitting: Emergency Medicine

## 2016-07-24 ENCOUNTER — Emergency Department (HOSPITAL_COMMUNITY): Payer: Medicaid Other

## 2016-07-24 ENCOUNTER — Encounter (HOSPITAL_COMMUNITY): Payer: Self-pay | Admitting: Emergency Medicine

## 2016-07-24 DIAGNOSIS — J45909 Unspecified asthma, uncomplicated: Secondary | ICD-10-CM | POA: Insufficient documentation

## 2016-07-24 DIAGNOSIS — N9489 Other specified conditions associated with female genital organs and menstrual cycle: Secondary | ICD-10-CM | POA: Insufficient documentation

## 2016-07-24 DIAGNOSIS — R51 Headache: Secondary | ICD-10-CM | POA: Diagnosis not present

## 2016-07-24 DIAGNOSIS — H538 Other visual disturbances: Secondary | ICD-10-CM | POA: Diagnosis not present

## 2016-07-24 DIAGNOSIS — Z79899 Other long term (current) drug therapy: Secondary | ICD-10-CM | POA: Insufficient documentation

## 2016-07-24 DIAGNOSIS — R079 Chest pain, unspecified: Secondary | ICD-10-CM | POA: Diagnosis present

## 2016-07-24 DIAGNOSIS — R42 Dizziness and giddiness: Secondary | ICD-10-CM | POA: Insufficient documentation

## 2016-07-24 DIAGNOSIS — R0789 Other chest pain: Secondary | ICD-10-CM | POA: Insufficient documentation

## 2016-07-24 LAB — I-STAT TROPONIN, ED: TROPONIN I, POC: 0 ng/mL (ref 0.00–0.08)

## 2016-07-24 LAB — CBC
HCT: 33.6 % — ABNORMAL LOW (ref 36.0–46.0)
Hemoglobin: 10.4 g/dL — ABNORMAL LOW (ref 12.0–15.0)
MCH: 23.5 pg — ABNORMAL LOW (ref 26.0–34.0)
MCHC: 31 g/dL (ref 30.0–36.0)
MCV: 76 fL — ABNORMAL LOW (ref 78.0–100.0)
PLATELETS: 239 10*3/uL (ref 150–400)
RBC: 4.42 MIL/uL (ref 3.87–5.11)
RDW: 16.7 % — ABNORMAL HIGH (ref 11.5–15.5)
WBC: 9.2 10*3/uL (ref 4.0–10.5)

## 2016-07-24 LAB — BASIC METABOLIC PANEL
ANION GAP: 10 (ref 5–15)
BUN: 11 mg/dL (ref 6–20)
CALCIUM: 9.4 mg/dL (ref 8.9–10.3)
CHLORIDE: 102 mmol/L (ref 101–111)
CO2: 28 mmol/L (ref 22–32)
CREATININE: 0.81 mg/dL (ref 0.44–1.00)
GFR calc non Af Amer: >60 mL/min (ref >60)
Glucose, Bld: 94 mg/dL (ref 65–99)
Potassium: 3.8 mmol/L (ref 3.5–5.1)
SODIUM: 140 mmol/L (ref 135–145)

## 2016-07-24 NOTE — ED Triage Notes (Signed)
Pt states she started having chest pain with headache and dizziness tonight starting at 2120

## 2016-07-24 NOTE — ED Provider Notes (Signed)
[facility]Y [facility]PT Provider Note   CSN: [id number] Arrival date & time: 07/24/16  2227  By signing my name below, I, Linna Darner, attest that this documentation has been prepared under the direction and in the presence of physician practitioner, Glynn Octave, MD. Electronically Signed: Linna Darner, Scribe. 07/24/2016. 11:29 PM.  History   Chief Complaint Chief Complaint  Patient presents with  . Chest Pain    The history is provided by the patient. No language interpreter was used.    Marland KitchenHPI Comments: Angela Mcclure is a 26 y.o. female who presents to the Emergency Department complaining of a resolved episode of non-radiating central chest pain that occurred from 9:20-10:00 PM tonight. She reports associated headache, intermittent lightheadedness, and intermittent double vision which are also all resolved; she states she feels normal currently. No h/o the same. No emotional upsets prior to onset. No regular medications or hormone therapy. She denies the possibility of pregnancy. LNMP 07/08/16. Pt does not wear glasses or contacts. She denies SOB, diaphoresis, numbness/tingling, nausea, vomiting, diarrhea, back pain, abdominal pain, syncope, or any other associated symptoms. No PCP.  Past Medical History:  Diagnosis Date  . Asthma     Patient Active Problem List   Diagnosis Date Noted  . 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 Obesity 10/18/2013  . History of gestational hypertension 10/18/2013  . Asthma 08/18/2012    Past Surgical History:  Procedure Laterality Date  . NO PAST SURGERIES      OB History    Gravida Para Term Preterm AB Living   3 3 3     3    SAB TAB Ectopic Multiple Live Births         0 3       Home Medications    Prior to Admission medications   Medication Sig Start Date End Date Taking? Authorizing Provider  amLODipine (NORVASC) 5 MG tablet Take 1 tablet (5 mg total) by mouth daily. Patient not taking: Reported on 05/27/2016 05/14/15   Cheral Marker, CNM  etonogestrel-ethinyl estradiol (NUVARING) 0.12-0.015 MG/24HR vaginal ring Insert vaginally and leave in place for 3 consecutive weeks, then remove for 1 week. 05/14/15   Cheral Marker, CNM    Family History Family History  Problem Relation Age of Onset  . Diabetes Other   . Hypertension Other   . Stroke Other     Social History Social History  Substance Use Topics  . Smoking status: Never Smoker  . Smokeless tobacco: Never Used  . Alcohol use No     Allergies   Patient has no known allergies.   Review of Systems Review of Systems  A complete 10 system review of systems was obtained and all systems are negative except as noted in the HPI and PMH.   Physical Exam Updated Vital Signs BP 141/80 (BP Location: Right Arm)   Pulse 69   Temp 98.6 F (37 C) (Oral)   Resp 16   Ht 5\' 5"  (1.651 m)   Wt 260 lb (117.9 kg)   LMP 08/09/2015 (Approximate)   SpO2 100%   BMI 43.27 kg/m   Physical Exam  Constitutional: She is oriented to person, place, and time. She appears well-developed and well-nourished. No distress.  Obese.  HENT:  Head: Normocephalic and atraumatic.  Mouth/Throat: Oropharynx is clear and moist. No oropharyngeal exudate.  Eyes: Conjunctivae and EOM are normal. Pupils are equal, round, and reactive to light.  Neck: Normal range of motion. Neck supple.  No meningismus.  Cardiovascular: Normal rate, regular rhythm, normal heart sounds and intact distal pulses.   No murmur heard. Pulmonary/Chest: Effort normal and breath sounds normal. No respiratory distress. She exhibits no tenderness.  Abdominal: Soft. There is no tenderness. There is no rebound and no guarding.  Musculoskeletal: Normal range of motion. She exhibits no edema or tenderness.  Neurological: She is alert and oriented to person, place, and time. No cranial nerve deficit. She exhibits normal muscle tone. Coordination normal.  No ataxia on finger to nose bilaterally. No pronator  drift. 5/5 strength throughout. CN 2-12 intact.Equal grip strength. Sensation intact.   Skin: Skin is warm.  Psychiatric: She has a normal mood and affect. Her behavior is normal.  Nursing note and vitals reviewed.    ED Treatments / Results  Labs (all labs ordered are listed, but only abnormal results are displayed) Labs Reviewed  CBC - Abnormal; Notable for the following:       Result Value   Hemoglobin 10.4 (*)    HCT 33.6 (*)    MCV 76.0 (*)    MCH 23.5 (*)    RDW 16.7 (*)    All other components within normal limits  BASIC METABOLIC PANEL  HCG, QUANTITATIVE, PREGNANCY  I-STAT TROPOININ, ED    EKG  EKG Interpretation  Date/Time:  Thursday July 24 2016 22:38:44 EST Ventricular Rate:  65 PR Interval:    QRS Duration: 97 QT Interval:  445 QTC Calculation: 463 R Axis:   74 Text Interpretation:  Sinus arrhythmia No previous ECGs available Confirmed by Manus GunningANCOUR  MD, Tiffiany Beadles [phone]([id number]) on 07/24/2016 11:26:15 PM       Radiology Dg Chest 2 View  Result Date: 07/24/2016 CLINICAL DATA:  Midsternal chest pain, dizziness, and headache starting earlier today. EXAM: CHEST  2 VIEW COMPARISON:  None. FINDINGS: The heart size and mediastinal contours are within normal limits. Both lungs are clear. The visualized skeletal structures are unremarkable. IMPRESSION: No active cardiopulmonary disease. Electronically Signed   By: Burman NievesWilliam  Stevens M.D.   On: 07/24/2016 23:05    Procedures Procedures (including critical care time)  DIAGNOSTIC STUDIES: Oxygen Saturation is 100% on RA, normal by my interpretation.    COORDINATION OF CARE: 11:35 PM Discussed treatment plan with pt at bedside and pt agreed to plan.  Medications Ordered in ED Medications - No data to display   Initial Impression / Assessment and Plan / ED Course  I have reviewed the triage vital signs and the nursing notes.  Pertinent labs & imaging results that were available during my care of the patient were reviewed  by me and considered in my medical decision making (see chart for details).     Episode of chest pain, lightheadedness, blurry vision that lasted about 30 minutes and is now resolved. Patient feels at baseline now. No further chest pain.  EKG shows normal sinus rhythm. Nonfocal neurological exam. Question episode of anxiety/panic attack. Labs with stable anemia.  HCG negative.  Doubt ACS. Doubt PE. She is PERC negative. Follow up with PCP. Return precautions discussed.  Final Clinical Impressions(s) / ED Diagnoses   Final diagnoses:  Atypical chest pain    New Prescriptions New Prescriptions   No medications on file  I personally performed the services described in this documentation, which was scribed in my presence. The recorded information has been reviewed and is accurate.    Glynn OctaveStephen Veleka Djordjevic, MD 07/25/16 [phone]

## 2016-07-24 NOTE — ED Notes (Signed)
Pt returned from xray,  

## 2016-07-24 NOTE — ED Notes (Signed)
Pt c/o mid center chest pain that started tonight with dizziness at  Home,

## 2016-07-25 LAB — HCG, QUANTITATIVE, PREGNANCY

## 2016-07-25 NOTE — ED Notes (Signed)
ED Provider at bedside. 

## 2016-07-25 NOTE — Discharge Instructions (Signed)
There is no evidence of heart attack. Follow up wi th your doctor. Return to the ED if you develop new or worsening symptoms. °

## 2016-07-25 NOTE — ED Notes (Signed)
Pt and family updated on plan of care, no distress noted, pt denies any pain,

## 2018-04-20 ENCOUNTER — Other Ambulatory Visit: Payer: Self-pay | Admitting: Adult Health

## 2018-04-29 ENCOUNTER — Other Ambulatory Visit: Payer: Self-pay | Admitting: Family Medicine

## 2018-06-22 ENCOUNTER — Other Ambulatory Visit: Payer: Self-pay | Admitting: Women's Health

## 2018-07-06 ENCOUNTER — Other Ambulatory Visit: Payer: Self-pay | Admitting: Women's Health

## 2018-12-30 ENCOUNTER — Other Ambulatory Visit: Payer: Self-pay

## 2018-12-30 ENCOUNTER — Other Ambulatory Visit: Payer: Self-pay | Admitting: Internal Medicine

## 2018-12-30 DIAGNOSIS — Z20822 Contact with and (suspected) exposure to covid-19: Secondary | ICD-10-CM

## 2019-01-02 LAB — NOVEL CORONAVIRUS, NAA: SARS-CoV-2, NAA: NOT DETECTED

## 2019-01-10 ENCOUNTER — Telehealth: Payer: Self-pay | Admitting: General Practice

## 2019-01-10 NOTE — Telephone Encounter (Signed)
Patient informed of COVID results and advised to log into my chart

## 2019-07-12 ENCOUNTER — Ambulatory Visit (INDEPENDENT_AMBULATORY_CARE_PROVIDER_SITE_OTHER): Payer: Medicaid Other | Admitting: Family Medicine

## 2019-07-12 ENCOUNTER — Other Ambulatory Visit: Payer: Self-pay

## 2019-07-12 ENCOUNTER — Encounter: Payer: Self-pay | Admitting: Family Medicine

## 2019-07-12 DIAGNOSIS — R5383 Other fatigue: Secondary | ICD-10-CM | POA: Diagnosis not present

## 2019-07-12 DIAGNOSIS — Z833 Family history of diabetes mellitus: Secondary | ICD-10-CM | POA: Diagnosis not present

## 2019-07-12 DIAGNOSIS — I10 Essential (primary) hypertension: Secondary | ICD-10-CM | POA: Insufficient documentation

## 2019-07-12 DIAGNOSIS — E559 Vitamin D deficiency, unspecified: Secondary | ICD-10-CM | POA: Diagnosis not present

## 2019-07-12 DIAGNOSIS — J4520 Mild intermittent asthma, uncomplicated: Secondary | ICD-10-CM

## 2019-07-12 NOTE — Assessment & Plan Note (Addendum)
Fatigue could be related to vitamin D deficiency, thyroid in addition to possible diabetes will be checking labs to make sure for either one of them and will address and treat as appropriate.  In addition I have encouraged her to eat a more well-balanced diet and drink more water.  As well as exercise 30 minutes at least 5 days a week.

## 2019-07-12 NOTE — Assessment & Plan Note (Signed)
Controlled, reports needs no inhalers at this time.

## 2019-07-12 NOTE — Assessment & Plan Note (Signed)
Angela Mcclure is educated about the importance of exercise daily to help with weight management. A minumum of 30 minutes daily is recommended. Additionally, importance of healthy food choices  with portion control discussed.   Wt Readings from Last 3 Encounters:  07/12/19 263 lb 1.9 oz (119.4 kg)  07/24/16 260 lb (117.9 kg)  05/27/16 240 lb (108.9 kg)

## 2019-07-12 NOTE — Patient Instructions (Addendum)
Happy New Year! May you have a year filled with hope, love, happiness and laughter.  I appreciate the opportunity to provide you with care for your health and wellness. Today we discussed: establish care   Follow up: 1 month   Labs this week  GOALS: Increase and walking daily.  Please continue to practice social distancing to keep you, your family, and our community safe.  If you must go out, please wear a mask and practice good handwashing.  It was a pleasure to see you and I look forward to continuing to work together on your health and well-being. Please do not hesitate to call the office if you need care or have questions about your care.  Have a wonderful day and week. With Gratitude, Tereasa Coop, DNP, AGNP-BC

## 2019-07-12 NOTE — Progress Notes (Signed)
Subjective:  Patient ID: Angela Mcclure, female    DOB: 1990/06/30  Age: 29 y.o. MRN: [id number]  CC:  Chief Complaint  Patient presents with  . New Patient (Initial Visit)    establish care, sometimes feels week, also has a bump under nipple but thinks it may be from piercing     HPI  HPI Angela Mcclure is a 29 year old female who presents today to establish care.  Only complaint today is that she sometimes feels weak and this fatigue does have family history of having diabetes in her mother.  She also has a bump underneath her nipple that she discovered about a month or so after she got her nipple pierced.  She has no pain or discomfort with this notify signs of infection no drainage from the nipple or any other issues.  Remote history of anxiety in the past.  Asthma as well.  Family history of diabetes, hypertension, stroke.  Does not use tobacco, alcohol or drugs.  Lives with the father of her 3 children.  Eats all food groups.  Reports drinking sweet tea.  Does not get a lot of water in her diet.  Is not exercising or physically active.  Does need to get established back with family tree to get her Pap smear.  Declines flu vaccine.  Tetanus is up-to-date.   Today patient denies signs and symptoms of COVID 19 infection including fever, chills, cough, shortness of breath, and headache. Past Medical, Surgical, Social History, Allergies, and Medications have been Reviewed.   Past Medical History:  Diagnosis Date  . Anxiety   . Asthma   . History of gestational hypertension 10/18/2013  . 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    ROS:  Review of Systems  Constitutional: Positive for malaise/fatigue.  HENT: Negative.   Eyes: Negative.   Respiratory: Negative.   Cardiovascular: Negative.   Gastrointestinal: Negative.   Genitourinary: Negative.        Cycles  have changes some   Musculoskeletal: Negative.   Skin: Negative.   Neurological: Negative.   Endo/Heme/Allergies: Negative.   Psychiatric/Behavioral: Negative.   All other systems reviewed and are negative.    Objective:   Today's Vitals: BP 136/78   Pulse 83   Temp (!) 97.5 F (36.4 C) (Temporal)   Resp 15   Ht 5\' 5"  (1.651 m)   Wt 263 lb 1.9 oz (119.4 kg)   SpO2 97%   BMI 43.79 kg/m  Vitals with BMI 07/12/2019 07/25/2016 07/24/2016  Height 5\' 5"  - 5\' 5"   Weight 263 lbs 2 oz - 260 lbs  BMI 93.71 - 69.6  Systolic 789 381 017  Diastolic 78 72 80  Pulse 83 60 69     Physical Exam Vitals and nursing note reviewed.  Constitutional:      Appearance: Normal appearance. She is well-developed and well-groomed. She is obese.  HENT:     Head: Normocephalic and atraumatic.     Comments: Mask in place     Right Ear: External ear normal.     Left Ear: External ear normal.  Eyes:     General:        Right eye: No discharge.        Left eye: No discharge.     Conjunctiva/sclera: Conjunctivae normal.  Cardiovascular:     Rate and Rhythm: Normal rate and regular  rhythm.     Pulses: Normal pulses.     Heart sounds: Normal heart sounds.  Pulmonary:     Effort: Pulmonary effort is normal.     Breath sounds: Normal breath sounds.  Musculoskeletal:        General: Normal range of motion.     Cervical back: Normal range of motion and neck supple.  Skin:    General: Skin is warm.  Neurological:     General: No focal deficit present.     Mental Status: She is alert and oriented to person, place, and time.  Psychiatric:        Attention and Perception: Attention normal.        Mood and Affect: Mood normal.        Speech: Speech normal.        Behavior: Behavior normal. Behavior is cooperative.        Thought Content: Thought content normal.        Cognition and Memory: Cognition normal.        Judgment: Judgment normal.      Assessment   1. Morbid obesity (HCC)   2.  Fatigue, unspecified type   3. Family history of diabetes mellitus in mother   4. Vitamin D deficiency   5. Hypertension, unspecified type   6. Mild intermittent asthma without complication     Tests ordered Orders Placed This Encounter  Procedures  . VITAMIN D 25 Hydroxy (Vit-D Deficiency, Fractures)  . COMPLETE METABOLIC PANEL WITH GFR  . CBC with Differential/Platelet  . Hemoglobin A1c  . TSH     Plan: Please see assessment and plan per problem list above.   No orders of the defined types were placed in this encounter.   Patient to follow-up in 08/11/2019   Freddy Finner, NP

## 2019-07-12 NOTE — Assessment & Plan Note (Signed)
We check an A1c just to make sure that she does not have diabetes.

## 2019-07-12 NOTE — Assessment & Plan Note (Signed)
Vitamin D deficiency can present as fatigue reports having some vitamin issues in the past.  We will be assessing his labs.

## 2019-07-12 NOTE — Assessment & Plan Note (Signed)
Systolically elevated BP given weight and possible diabetes we will be monitoring this regularly.  Having her follow-up in 4 weeks to see if she can start losing some weight and work on her diet encouraged low sodium possible be able to get a reduction otherwise we will start her on low-dose diuretic to keep in control.

## 2019-07-18 ENCOUNTER — Encounter: Payer: Self-pay | Admitting: Family Medicine

## 2019-07-19 ENCOUNTER — Other Ambulatory Visit: Payer: Self-pay | Admitting: Family Medicine

## 2019-07-19 DIAGNOSIS — E559 Vitamin D deficiency, unspecified: Secondary | ICD-10-CM

## 2019-07-19 LAB — CBC WITH DIFFERENTIAL/PLATELET
Absolute Monocytes: 317 cells/uL (ref 200–950)
Basophils Absolute: 20 cells/uL (ref 0–200)
Basophils Relative: 0.3 %
Eosinophils Absolute: 79 cells/uL (ref 15–500)
Eosinophils Relative: 1.2 %
HCT: 33.6 % — ABNORMAL LOW (ref 35.0–45.0)
Hemoglobin: 10.4 g/dL — ABNORMAL LOW (ref 11.7–15.5)
Lymphs Abs: 2825 cells/uL (ref 850–3900)
MCH: 22.3 pg — ABNORMAL LOW (ref 27.0–33.0)
MCHC: 31 g/dL — ABNORMAL LOW (ref 32.0–36.0)
MCV: 72.1 fL — ABNORMAL LOW (ref 80.0–100.0)
MPV: 10.7 fL (ref 7.5–12.5)
Monocytes Relative: 4.8 %
Neutro Abs: 3359 cells/uL (ref 1500–7800)
Neutrophils Relative %: 50.9 %
Platelets: 276 10*3/uL (ref 140–400)
RBC: 4.66 10*6/uL (ref 3.80–5.10)
RDW: 16.2 % — ABNORMAL HIGH (ref 11.0–15.0)
Total Lymphocyte: 42.8 %
WBC: 6.6 10*3/uL (ref 3.8–10.8)

## 2019-07-19 LAB — COMPLETE METABOLIC PANEL WITH GFR
AG Ratio: 1.3 (calc) (ref 1.0–2.5)
ALT: 14 U/L (ref 6–29)
AST: 13 U/L (ref 10–30)
Albumin: 4.2 g/dL (ref 3.6–5.1)
Alkaline phosphatase (APISO): 50 U/L (ref 31–125)
BUN: 9 mg/dL (ref 7–25)
CO2: 25 mmol/L (ref 20–32)
Calcium: 9.3 mg/dL (ref 8.6–10.2)
Chloride: 105 mmol/L (ref 98–110)
Creat: 0.85 mg/dL (ref 0.50–1.10)
GFR, Est African American: 107 mL/min/{1.73_m2} (ref >=60)
GFR, Est Non African American: 93 mL/min/{1.73_m2} (ref >=60)
Globulin: 3.3 g/dL (calc) (ref 1.9–3.7)
Glucose, Bld: 87 mg/dL (ref 65–99)
Potassium: 4 mmol/L (ref 3.5–5.3)
Sodium: 138 mmol/L (ref 135–146)
Total Bilirubin: 0.3 mg/dL (ref 0.2–1.2)
Total Protein: 7.5 g/dL (ref 6.1–8.1)

## 2019-07-19 LAB — VITAMIN D 25 HYDROXY (VIT D DEFICIENCY, FRACTURES): Vit D, 25-Hydroxy: 8 ng/mL — ABNORMAL LOW (ref 30–100)

## 2019-07-19 LAB — TSH: TSH: 1.67 mIU/L

## 2019-07-19 LAB — HEMOGLOBIN A1C
Hgb A1c MFr Bld: 5.5 % of total Hgb (ref <5.7)
Mean Plasma Glucose: 111 (calc)
eAG (mmol/L): 6.2 (calc)

## 2019-07-19 MED ORDER — VITAMIN D (ERGOCALCIFEROL) 1.25 MG (50000 UNIT) PO CAPS: 50000.0000 [IU] | ORAL_CAPSULE | ORAL | 0 refills | Status: DC

## 2019-08-11 ENCOUNTER — Ambulatory Visit: Payer: Medicaid Other | Admitting: Family Medicine

## 2019-08-19 ENCOUNTER — Ambulatory Visit: Payer: Medicaid Other | Admitting: Family Medicine

## 2019-08-24 ENCOUNTER — Other Ambulatory Visit: Payer: Self-pay

## 2019-08-24 ENCOUNTER — Encounter: Payer: Self-pay | Admitting: Family Medicine

## 2019-08-24 ENCOUNTER — Ambulatory Visit (INDEPENDENT_AMBULATORY_CARE_PROVIDER_SITE_OTHER): Payer: Medicaid Other | Admitting: Family Medicine

## 2019-08-24 VITALS — BP 120/78 | HR 76 | Temp 98.4°F | Resp 16 | Ht 65.0 in | Wt 262.0 lb

## 2019-08-24 DIAGNOSIS — D509 Iron deficiency anemia, unspecified: Secondary | ICD-10-CM | POA: Insufficient documentation

## 2019-08-24 DIAGNOSIS — I10 Essential (primary) hypertension: Secondary | ICD-10-CM

## 2019-08-24 MED ORDER — IRON 325 (65 FE) MG PO TABS: 325.0000 mg | ORAL_TABLET | Freq: Two times a day (BID) | ORAL | 3 refills | Status: DC

## 2019-08-24 NOTE — Assessment & Plan Note (Signed)
Started on iron, recheck in 3 months. Encouraged well balanced diet.

## 2019-08-24 NOTE — Patient Instructions (Signed)
I appreciate the opportunity to provide you with care for your health and wellness. Today we discussed: blood pressure, weight and iron   Follow up: 3 months   Start taking iron daily as directed Continue vitamin D  Start walking daily :)   Please continue to practice social distancing to keep you, your family, and our community safe.  If you must go out, please wear a mask and practice good handwashing.  It was a pleasure to see you and I look forward to continuing to work together on your health and well-being. Please do not hesitate to call the office if you need care or have questions about your care.  Have a wonderful day and week. With Gratitude, Tereasa Coop, DNP, AGNP-BC

## 2019-08-24 NOTE — Assessment & Plan Note (Signed)
BP well controlled today, no need for medications. Will monitor at future visits.

## 2019-08-24 NOTE — Progress Notes (Signed)
Subjective:  Patient ID: Angela Mcclure, female    DOB: 07-06-1990  Age: 29 y.o. MRN: [id number]  CC:  Chief Complaint  Patient presents with  . Hypertension    follow up visit   . Obesity      HPI  HPI  Ms. Angela Mcclure is a 29 year old female patient who presents today for follow-up of hypertension.  She establish care with me in January and had demonstrated elevated BP.  Wanted close follow-up in 4 weeks to make sure that this did not need to have treatment.  She reports doing really well blood pressure control today.  Reports that she wants me to send in her iron to see if the insurance will cover it.  She started her vitamin D but reports that she still feels sleepy.  Denies having any other concerns today to discuss.  Today patient denies signs and symptoms of COVID 19 infection including fever, chills, cough, shortness of breath, and headache. Past Medical, Surgical, Social History, Allergies, and Medications have been Reviewed.   Past Medical History:  Diagnosis Date  . Anxiety   . Asthma   . History of gestational hypertension 10/18/2013  . 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    ROS:  Review of Systems  Constitutional: Negative.   HENT: Negative.   Eyes: Negative.   Respiratory: Negative.   Cardiovascular: Negative.   Gastrointestinal: Negative.   Genitourinary: Negative.   Musculoskeletal: Negative.   Skin: Negative.   Neurological: Negative.   Endo/Heme/Allergies: Negative.   Psychiatric/Behavioral: Negative.   All other systems reviewed and are negative.    Objective:   Today's Vitals: BP 120/78   Pulse 76   Temp 98.4 F (36.9 C) (Temporal)   Resp 16   Ht 5\' 5"  (1.651 m)   Wt 262 lb (118.8 kg)   SpO2 98%   BMI 43.60 kg/m  Vitals with BMI 08/24/2019 07/12/2019 07/25/2016  Height 5\' 5"  5\' 5"  -  Weight 262 lbs 263 lbs 2 oz -  BMI  43.6 43.79 -  Systolic 120 136 09/22/2016  Diastolic 78 78 72  Pulse 76 83 60     Physical Exam Vitals and nursing note reviewed.  Constitutional:      Appearance: Normal appearance. She is well-developed and well-groomed. She is obese.  HENT:     Head: Normocephalic and atraumatic.     Right Ear: External ear normal.     Left Ear: External ear normal.     Mouth/Throat:     Comments: Mask in place  Eyes:     General:        Right eye: No discharge.        Left eye: No discharge.     Conjunctiva/sclera: Conjunctivae normal.  Cardiovascular:     Rate and Rhythm: Normal rate and regular rhythm.     Pulses: Normal pulses.     Heart sounds: Normal heart sounds.  Pulmonary:     Effort: Pulmonary effort is normal.     Breath sounds: Normal breath sounds.  Musculoskeletal:        General: Normal range of motion.     Cervical back: Normal range of motion and neck supple.  Skin:    General: Skin is warm.  Neurological:     General: No focal deficit present.     Mental Status: She is alert and  oriented to person, place, and time.  Psychiatric:        Attention and Perception: Attention normal.        Mood and Affect: Mood normal.        Speech: Speech normal.        Behavior: Behavior normal. Behavior is cooperative.        Thought Content: Thought content normal.        Cognition and Memory: Cognition normal.        Judgment: Judgment normal.          Assessment   1. Iron deficiency anemia, unspecified iron deficiency anemia type   2. Hypertension, unspecified type     Tests ordered No orders of the defined types were placed in this encounter.  Plan: Please see assessment and plan per problem list above.   Meds ordered this encounter  Medications  . Ferrous Sulfate (IRON) 325 (65 Fe) MG TABS    Sig: Take 1 tablet (325 mg total) by mouth 2 (two) times daily.    Dispense:  30 tablet    Refill:  3    Order Specific Question:   Supervising Provider    Answer:    Fayrene Helper [[id number]]    Patient to follow-up in 3 months  Perlie Mayo, NP

## 2019-11-24 ENCOUNTER — Ambulatory Visit: Payer: Medicaid Other | Admitting: Family Medicine

## 2019-12-06 ENCOUNTER — Ambulatory Visit: Payer: Medicaid Other | Admitting: Family Medicine

## 2020-01-18 ENCOUNTER — Encounter: Payer: Self-pay | Admitting: Family Medicine

## 2020-01-18 ENCOUNTER — Other Ambulatory Visit: Payer: Self-pay

## 2020-01-18 ENCOUNTER — Ambulatory Visit (INDEPENDENT_AMBULATORY_CARE_PROVIDER_SITE_OTHER): Payer: Medicaid Other | Admitting: Family Medicine

## 2020-01-18 VITALS — BP 118/74 | HR 67 | Temp 97.3°F | Resp 18 | Ht 65.0 in | Wt 246.1 lb

## 2020-01-18 DIAGNOSIS — N61 Mastitis without abscess: Secondary | ICD-10-CM | POA: Diagnosis not present

## 2020-01-18 MED ORDER — DOXYCYCLINE HYCLATE 100 MG PO TABS: 100.0000 mg | ORAL_TABLET | Freq: Two times a day (BID) | ORAL | 0 refills | Status: AC

## 2020-01-18 NOTE — Assessment & Plan Note (Addendum)
Questionable cellulitis/nipple infection of female who has been pierced with a barbell ring. Noted swelling and difficulty with manipulation of the ring some slight drainage. Nipple and circumference around nipple is little firmer on that left side than it is on the right.  Prescribed doxycycline for questionable infection as stated above. Encouraged to keep the area clean and dry.  Encouraged not to manipulate or mess with ring at this time once antibiotic is in place she will need to start cleaning and moving around as question as to whether there is scar tissue that ripped with ring movement.

## 2020-01-18 NOTE — Patient Instructions (Addendum)
I appreciate the opportunity to provide you with care for your health and wellness. Today we discussed: Nipple infection  Follow up: Has an appointment next Wednesday please keep as we can follow-up and make sure this is better as well.  No labs or referrals today  Take medications as directed.  Please keep area clean and dry.  Please do not manipulate the nipple or the ring at this time.  Once antibiotics have been in place please try to loosen up the ring and move it back and forth to help prevent scar tissue/adhesions.  If you continued to have an issue we might need to have you take help the ring to let a full healing process.  Please continue to practice social distancing to keep you, your family, and our community safe.  If you must go out, please wear a mask and practice good handwashing.  It was a pleasure to see you and I look forward to continuing to work together on your health and well-being. Please do not hesitate to call the office if you need care or have questions about your care.  Have a wonderful day and week. With Gratitude, Tereasa Coop, DNP, AGNP-BC

## 2020-01-18 NOTE — Progress Notes (Signed)
Subjective:  Patient ID: Angela Mcclure, female    DOB: [birth year]  Age: 29 y.o. MRN: [id number]  CC:  Chief Complaint  Patient presents with  . Acute Visit      HPI  HPI  Angela Mcclure is a 29 year old female patient who presents today after having some discomfort in her nipples.  She had her nipples pierced about a year ago.  She reports in the last 2 weeks the left side has started hurting a lot more.  She denies having any recent injury or trauma to the site.  She denies having any manipulation that she is aware of outside the fact that she does sleep on her stomach.  When in the shower she is trying to clean the area she has noticed some drainage/pus from one of the sides.  She has a very difficult time moving the barbell back and forth.  Right side does not have any of this difficulty.  She has some noted swelling around the nipple itself that is firm.  And continuous pain.  Has not had any redness that she is aware of.  No fevers or chills.  Today patient denies signs and symptoms of COVID 19 infection including fever, chills, cough, shortness of breath, and headache. Past Medical, Surgical, Social History, Allergies, and Medications have been Reviewed.   Past Medical History:  Diagnosis Date  . Anxiety   . Asthma   . History of gestational hypertension 10/18/2013  . Postpartum hypertension 05/14/2015    No outpatient medications have been marked as taking for the 01/18/20 encounter (Office Visit) with Freddy Finner, NP.    ROS:  Review of Systems  Constitutional: Negative.   HENT: Negative.   Eyes: Negative.   Respiratory: Negative.   Cardiovascular: Negative.   Gastrointestinal: Negative.   Genitourinary: Negative.   Musculoskeletal: Negative.   Skin: Negative.        See hpi  Neurological: Negative.   Endo/Heme/Allergies: Negative.   Psychiatric/Behavioral: Negative.   All other systems reviewed and are negative.    Objective:   Today's Vitals: BP 118/74  (BP Location: Right Arm, Patient Position: Sitting, Cuff Size: Normal)   Pulse 67   Temp (!) 97.3 F (36.3 C) (Temporal)   Resp 18   Ht 5\' 5"  (1.651 m)   Wt 246 lb 1.9 oz (111.6 kg)   SpO2 97%   BMI 40.96 kg/m  Vitals with BMI 01/18/2020 08/24/2019 07/12/2019  Height 5\' 5"  5\' 5"  5\' 5"   Weight 246 lbs 2 oz 262 lbs 263 lbs 2 oz  BMI 40.96 43.6 43.79  Systolic 118 120 07/14/2019  Diastolic 74 78 78  Pulse 67 76 83     Physical Exam Vitals and nursing note reviewed.  Constitutional:      Appearance: Normal appearance. She is well-developed and well-groomed. She is obese.  HENT:     Head: Normocephalic and atraumatic.     Right Ear: External ear normal.     Left Ear: External ear normal.     Mouth/Throat:     Comments: Mask in place Eyes:     General:        Right eye: No discharge.        Left eye: No discharge.     Conjunctiva/sclera: Conjunctivae normal.  Cardiovascular:     Rate and Rhythm: Normal rate and regular rhythm.     Pulses: Normal pulses.     Heart sounds: Normal heart sounds.  Pulmonary:  Effort: Pulmonary effort is normal.     Breath sounds: Normal breath sounds.  Chest:     Breasts:        Right: Normal.        Left: Swelling, nipple discharge and tenderness present.     Comments: Bilateral nipple piercings with barbell  Left nipple shows swelling, firmness around the nipple and area around not fully extended throughout the areola, some slight nipple discharge with manipulation through the entry point of the barbell. Musculoskeletal:        General: Normal range of motion.     Cervical back: Normal range of motion and neck supple.  Skin:    General: Skin is warm.  Neurological:     General: No focal deficit present.     Mental Status: She is alert and oriented to person, place, and time.  Psychiatric:        Attention and Perception: Attention normal.        Mood and Affect: Mood normal.        Speech: Speech normal.        Behavior: Behavior normal.  Behavior is cooperative.        Thought Content: Thought content normal.        Cognition and Memory: Cognition normal.        Judgment: Judgment normal.      Assessment   1. Nipple infection in female     Tests ordered No orders of the defined types were placed in this encounter.    Plan: Please see assessment and plan per problem list above.   Meds ordered this encounter  Medications  . doxycycline (VIBRA-TABS) 100 MG tablet    Sig: Take 1 tablet (100 mg total) by mouth 2 (two) times daily for 7 days.    Dispense:  14 tablet    Refill:  0    Order Specific Question:   Supervising Provider    Answer:   Kerri Perches [[id number]]    Patient to follow-up in as scheduled.  Freddy Finner, NP

## 2020-01-25 ENCOUNTER — Telehealth (INDEPENDENT_AMBULATORY_CARE_PROVIDER_SITE_OTHER): Payer: Medicaid Other | Admitting: Family Medicine

## 2020-01-25 ENCOUNTER — Other Ambulatory Visit: Payer: Self-pay

## 2020-01-25 ENCOUNTER — Encounter: Payer: Self-pay | Admitting: Family Medicine

## 2020-01-25 DIAGNOSIS — N61 Mastitis without abscess: Secondary | ICD-10-CM | POA: Diagnosis not present

## 2020-01-25 DIAGNOSIS — J4520 Mild intermittent asthma, uncomplicated: Secondary | ICD-10-CM

## 2020-01-25 NOTE — Patient Instructions (Addendum)
I appreciate the opportunity to provide you with care for your health and wellness. Today we discussed: overall health and recent infection   Follow up: March for Annual -fasting morning appt   No labs or referrals today  Call if infection does not clear.   Please continue to practice social distancing to keep you, your family, and our community safe.  If you must go out, please wear a mask and practice good handwashing.  It was a pleasure to see you and I look forward to continuing to work together on your health and well-being. Please do not hesitate to call the office if you need care or have questions about your care.  Have a wonderful day and week. With Gratitude, Tereasa Coop, DNP, AGNP-BC

## 2020-01-25 NOTE — Assessment & Plan Note (Signed)
Controlled, no high inhalers.  Did not have any issues with allergies this year flare anything up.

## 2020-01-25 NOTE — Progress Notes (Signed)
Virtual Visit via Telephone Note   This visit type was conducted due to national recommendations for restrictions regarding the COVID-19 Pandemic (e.g. social distancing) in an effort to limit this patient's exposure and mitigate transmission in our community.  Due to her co-morbid illnesses, this patient is at least at moderate risk for complications without adequate follow up.  This format is felt to be most appropriate for this patient at this time.  The patient did not have access to video technology/had technical difficulties with video requiring transitioning to audio format only (telephone).  All issues noted in this document were discussed and addressed.  No physical exam could be performed with this format.    Evaluation Performed:  Follow-up visit  Date:  01/25/2020   ID:  Angela Mcclure, DOB [birth year], MRN [id number]  Patient Location: Home Provider Location: Office/Clinic  Location of Patient: Home Location of Provider: Telehealth Consent was obtain for visit to be over via telehealth. I verified that I am speaking with the correct person using two identifiers.  PCP:  Freddy Finner, NP   Chief Complaint: 31-month follow-up  History of Present Illness:    Angela Mcclure is a 29 y.o. female with recent nipple infection, will continue doxycycline.  Today.  Reports that is gotten a little bit better but not fully recovered yet.  Possible need for surgery consult if there is an infection behind the nipple that we cannot clear.  She is okay with this is what needs to happen.   She denies having any issues or concerns to discuss outside of that.  Reports that she is doing well with her asthma.  Does not have any anxiety.  Is sleeping well.  Is eating well.  Not having any changes in bowel or bladder habits. Denies having any chest pain, fever, chills, cough or new shortness of breath.  The patient does not have symptoms concerning for COVID-19 infection (fever, chills, cough,  or new shortness of breath).   Past Medical, Surgical, Social History, Allergies, and Medications have been Reviewed.  Past Medical History:  Diagnosis Date   Anxiety    Asthma    History of gestational hypertension 10/18/2013   Postpartum hypertension 05/14/2015   Past Surgical History:  Procedure Laterality Date   NO PAST SURGERIES       Current Meds  Medication Sig   doxycycline (VIBRA-TABS) 100 MG tablet Take 1 tablet (100 mg total) by mouth 2 (two) times daily for 7 days.   Ferrous Sulfate (IRON) 325 (65 Fe) MG TABS Take 1 tablet (325 mg total) by mouth 2 (two) times daily.   Vitamin D, Ergocalciferol, (DRISDOL) 1.25 MG (50000 UNIT) CAPS capsule Take 1 capsule (50,000 Units total) by mouth every 7 (seven) days.     Allergies:   Patient has no known allergies.   ROS:   Please see the history of present illness.    All other systems reviewed and are negative.   Labs/Other Tests and Data Reviewed:    Recent Labs: 07/18/2019: ALT 14; BUN 9; Creat 0.85; Hemoglobin 10.4; Platelets 276; Potassium 4.0; Sodium 138; TSH 1.67   Recent Lipid Panel No results found for: CHOL, TRIG, HDL, CHOLHDL, LDLCALC, LDLDIRECT  Wt Readings from Last 3 Encounters:  01/25/20 246 lb (111.6 kg)  01/18/20 246 lb 1.9 oz (111.6 kg)  08/24/19 262 lb (118.8 kg)     Objective:    Vital Signs:  BP 118/74    Ht 5\' 5"  (  1.651 m)    Wt 246 lb (111.6 kg)    BMI 40.94 kg/m    VITAL SIGNS:  reviewed GEN:  Alert and oriented RESPIRATORY:  No shortness of breath noted in conversation PSYCH:  Normal affect and mood  ASSESSMENT & PLAN:     1. Morbid obesity (HCC)   2. Nipple infection in female   3. Mild intermittent asthma without complication    Time:   Today, I have spent 10 minutes with the patient with telehealth technology discussing the above problems.     Medication Adjustments/Labs and Tests Ordered: Current medicines are reviewed at length with the patient today.  Concerns  regarding medicines are outlined above.   Tests Ordered: No orders of the defined types were placed in this encounter.   Medication Changes: No orders of the defined types were placed in this encounter.  Disposition:  Follow up  March for annual  Signed, Freddy Finner, NP  01/25/2020 3:45 PM     [facility]

## 2020-01-25 NOTE — Assessment & Plan Note (Signed)
Improving.  Last day of doxy is today.  Reports she still has little tenderness but the drainage is better.  Possible need for surgery referral or wound clinic if the infection does not seem to clear.

## 2020-01-25 NOTE — Assessment & Plan Note (Signed)
Improved from March  Encouraged to continue diet and lifestyle changes including exercise 30 to 60 minutes every day  Wt Readings from Last 3 Encounters:  01/25/20 246 lb (111.6 kg)  01/18/20 246 lb 1.9 oz (111.6 kg)  08/24/19 262 lb (118.8 kg)

## 2020-02-22 ENCOUNTER — Encounter: Payer: Self-pay | Admitting: Family Medicine

## 2020-03-01 ENCOUNTER — Telehealth: Payer: Medicaid Other | Admitting: Family Medicine

## 2020-03-01 ENCOUNTER — Other Ambulatory Visit: Payer: Self-pay

## 2020-03-03 ENCOUNTER — Other Ambulatory Visit: Payer: Self-pay

## 2020-03-03 ENCOUNTER — Ambulatory Visit
Admission: EM | Admit: 2020-03-03 | Discharge: 2020-03-03 | Disposition: A | Discharge status: Home / Self Care | Payer: Medicaid Other | Attending: Emergency Medicine | Admitting: Emergency Medicine

## 2020-03-03 DIAGNOSIS — Z1152 Encounter for screening for COVID-19: Secondary | ICD-10-CM

## 2020-03-05 LAB — SARS-COV-2, NAA 2 DAY TAT

## 2020-03-05 LAB — NOVEL CORONAVIRUS, NAA: SARS-CoV-2, NAA: NOT DETECTED

## 2020-04-11 ENCOUNTER — Encounter: Payer: Self-pay | Admitting: Family Medicine

## 2020-04-17 ENCOUNTER — Ambulatory Visit (INDEPENDENT_AMBULATORY_CARE_PROVIDER_SITE_OTHER): Payer: Medicaid Other | Admitting: Family Medicine

## 2020-04-17 ENCOUNTER — Encounter: Payer: Self-pay | Admitting: Family Medicine

## 2020-04-17 ENCOUNTER — Other Ambulatory Visit: Payer: Self-pay

## 2020-04-17 VITALS — BP 126/74 | HR 72 | Ht 65.0 in | Wt 245.0 lb

## 2020-04-17 DIAGNOSIS — F419 Anxiety disorder, unspecified: Secondary | ICD-10-CM | POA: Insufficient documentation

## 2020-04-17 NOTE — Patient Instructions (Addendum)
  HAPPY FALL!  I appreciate the opportunity to provide you with care for your health and wellness. Today we discussed: anxiety -untreated   Follow up: as needed  No labs or referrals today  Pet Therapy Note today  Have a great Holiday Season!  Please continue to practice social distancing to keep you, your family, and our community safe.  If you must go out, please wear a mask and practice good handwashing.  It was a pleasure to see you and I look forward to continuing to work together on your health and well-being. Please do not hesitate to call the office if you need care or have questions about your care.  Have a wonderful day and week. With Gratitude, Tereasa Coop, DNP, AGNP-BC

## 2020-04-17 NOTE — Progress Notes (Deleted)
     Subjective:  Patient ID: Angela Mcclure, female    DOB: 16-Oct-1990  Age: 29 y.o. MRN: [id number]  CC:  Chief Complaint  Patient presents with  . Anxiety    x4 years, worsening      HPI  HPI  Today patient denies signs and symptoms of COVID 19 infection including fever, chills, cough, shortness of breath, and headache. Past Medical, Surgical, Social History, Allergies, and Medications have been Reviewed.   Past Medical History:  Diagnosis Date  . Anxiety   . Asthma   . History of gestational hypertension 10/18/2013  . Postpartum hypertension 05/14/2015    No outpatient medications have been marked as taking for the 04/17/20 encounter (Office Visit) with Freddy Finner, NP.    ROS:  ROS   Objective:   Today's Vitals: BP 126/74 (BP Location: Right Arm, Patient Position: Sitting, Cuff Size: Large)   Pulse 72   Ht 5\' 5"  (1.651 m)   Wt 245 lb (111.1 kg)   LMP 03/30/2020   SpO2 98%   BMI 40.77 kg/m  Vitals with BMI 04/17/2020 03/03/2020 01/25/2020  Height 5\' 5"  - 5\' 5"   Weight 245 lbs - 246 lbs  BMI 40.77 - 40.94  Systolic 126 150 03/26/2020  Diastolic 74 80 74  Pulse 72 66 -     Physical Exam       Assessment   No diagnosis found.  Tests ordered No orders of the defined types were placed in this encounter.    Plan: Please see assessment and plan per problem list above.   No orders of the defined types were placed in this encounter.   Patient to follow-up in ***.  Note: This dictation was prepared with Dragon dictation along with smaller phrase technology. Similar sounding words can be transcribed inadequately or may not be corrected upon review. Any transcriptional errors that result from this process are unintentional.      , NP

## 2020-04-17 NOTE — Assessment & Plan Note (Signed)
Change in management where she lives.  Needs no now to keep her dog.  Got her dog secondary to having anxiety and reports that his helped a lot we will continue nonpharmacological intervention with pet therapy.  Note provided for apartment complex.  JVD is elevated I have offered her medication she declines.  Reports that she is just anxious that they want let her keep her pet.  Denies having any SI or HI.

## 2020-04-17 NOTE — Progress Notes (Signed)
Subjective:  Patient ID: Angela Mcclure, female    DOB: Jan 29, 1991  Age: 29 y.o. MRN: [id number]  CC:  Chief Complaint  Patient presents with   Anxiety    x4 years, worsening      HPI  Angela Mcclure is here for an acute visit today secondary to anxiety.  She has had anxiety off and on for 4 years.  She reports that she is never seek out treatment for it.  Has never had any pharmacological treatment.  Is not prone to therapy.  She reports that about a year ago when she got her dog she noticed that her symptoms had improved.  However her complex has switched owners and is now a new pet apartment.  And she needs to have a note to help with continuation of pet therapy.  He prefers to have nonpharmacological intervention and reports that her dog helps her tremendously.  Anxiety Presents for initial visit. Onset was 1 to 5 years ago. The problem has been waxing and waning. Symptoms include depressed mood, insomnia, irritability, muscle tension, nervous/anxious behavior and obsessions. Symptoms occur most days. The severity of symptoms is moderate. The symptoms are aggravated by family issues and social activities. Hours of sleep per night: varies. The quality of sleep is fair. Nighttime awakenings: occasional.   There are no known risk factors.  Her past medical history is significant for anxiety/panic attacks. Past treatments include nothing.   She denies having any recent panic attacks.  Denies having any chest pain, cough, shortness of breath, headaches dizziness or vision changes   Today patient denies signs and symptoms of COVID 19 infection including fever, chills, cough, shortness of breath, and headache. Past Medical, Surgical, Social History, Allergies, and Medications have been Reviewed.   Past Medical History:  Diagnosis Date   Anxiety    Asthma    History of gestational hypertension 10/18/2013   Postpartum hypertension 05/14/2015    No outpatient medications have been  marked as taking for the 04/17/20 encounter (Office Visit) with Angela Finner, NP.    ROS:  Review of Systems  Constitutional: Positive for irritability.  Psychiatric/Behavioral: The patient is nervous/anxious and has insomnia.      Objective:   Today's Vitals: BP 126/74 (BP Location: Right Arm, Patient Position: Sitting, Cuff Size: Large)    Pulse 72    Ht 5\' 5"  (1.651 m)    Wt 245 lb (111.1 kg)    LMP 03/30/2020    SpO2 98%    BMI 40.77 kg/m  Vitals with BMI 04/17/2020 03/03/2020 01/25/2020  Height 5\' 5"  - 5\' 5"   Weight 245 lbs - 246 lbs  BMI 40.77 - 40.94  Systolic 126 150 03/26/2020  Diastolic 74 80 74  Pulse 72 66 -     Physical Exam Vitals and nursing note reviewed.  Constitutional:      Appearance: Normal appearance. She is well-developed and well-groomed. She is obese.  HENT:     Head: Normocephalic and atraumatic.     Right Ear: External ear normal.     Left Ear: External ear normal.     Mouth/Throat:     Comments: Mask in place Eyes:     General:        Right eye: No discharge.        Left eye: No discharge.     Conjunctiva/sclera: Conjunctivae normal.  Cardiovascular:     Rate and Rhythm: Normal rate and regular rhythm.  Pulses: Normal pulses.     Heart sounds: Normal heart sounds.  Pulmonary:     Effort: Pulmonary effort is normal.     Breath sounds: Normal breath sounds.  Musculoskeletal:        General: Normal range of motion.     Cervical back: Normal range of motion and neck supple.  Skin:    General: Skin is warm.  Neurological:     General: No focal deficit present.     Mental Status: She is alert and oriented to person, place, and time.  Psychiatric:        Attention and Perception: Attention normal.        Mood and Affect: Mood normal.        Speech: Speech normal.        Behavior: Behavior normal. Behavior is cooperative.        Thought Content: Thought content normal.        Cognition and Memory: Cognition normal.        Judgment: Judgment  normal.     GAD 7 : Generalized Anxiety Score 04/17/2020  Nervous, Anxious, on Edge 3  Control/stop worrying 2  Worry too much - different things 2  Trouble relaxing 2  Restless 0  Easily annoyed or irritable 2  Afraid - awful might happen 3  Total GAD 7 Score 14  Anxiety Difficulty Very difficult      Assessment   1. Anxiety     Tests ordered No orders of the defined types were placed in this encounter.    Plan: Please see assessment and plan per problem list above.   No orders of the defined types were placed in this encounter.   Patient to follow-up in as needed  Note: This dictation was prepared with Dragon dictation along with smaller phrase technology. Similar sounding words can be transcribed inadequately or may not be corrected upon review. Any transcriptional errors that result from this process are unintentional.      Angela Finner, NP

## 2020-04-25 DIAGNOSIS — Z8489 Family history of other specified conditions: Secondary | ICD-10-CM

## 2020-07-26 ENCOUNTER — Encounter: Payer: Self-pay | Admitting: Family Medicine

## 2020-07-30 ENCOUNTER — Ambulatory Visit: Payer: Medicaid Other | Admitting: Nurse Practitioner

## 2021-01-29 ENCOUNTER — Ambulatory Visit: Payer: Medicaid Other | Admitting: Nurse Practitioner

## 2021-01-29 ENCOUNTER — Ambulatory Visit: Payer: Medicaid Other | Admitting: Family Medicine

## 2021-02-13 ENCOUNTER — Other Ambulatory Visit: Payer: Medicaid Other | Admitting: Adult Health

## 2021-02-19 ENCOUNTER — Ambulatory Visit: Payer: Medicaid Other | Admitting: Nurse Practitioner

## 2021-03-27 ENCOUNTER — Ambulatory Visit (INDEPENDENT_AMBULATORY_CARE_PROVIDER_SITE_OTHER): Payer: Medicaid Other | Admitting: Adult Health

## 2021-03-27 ENCOUNTER — Encounter: Payer: Self-pay | Admitting: Adult Health

## 2021-03-27 ENCOUNTER — Other Ambulatory Visit: Payer: Self-pay

## 2021-03-27 ENCOUNTER — Other Ambulatory Visit (HOSPITAL_COMMUNITY)
Admission: RE | Admit: 2021-03-27 | Discharge: 2021-03-27 | Disposition: A | Discharge status: Home / Self Care | Payer: Medicaid Other | Source: Ambulatory Visit | Attending: Adult Health | Admitting: Adult Health

## 2021-03-27 VITALS — BP 131/83 | HR 66 | Ht 65.0 in | Wt 261.0 lb

## 2021-03-27 DIAGNOSIS — E559 Vitamin D deficiency, unspecified: Secondary | ICD-10-CM

## 2021-03-27 DIAGNOSIS — Z01419 Encounter for gynecological examination (general) (routine) without abnormal findings: Secondary | ICD-10-CM | POA: Insufficient documentation

## 2021-03-27 DIAGNOSIS — Z319 Encounter for procreative management, unspecified: Secondary | ICD-10-CM | POA: Diagnosis not present

## 2021-03-27 MED ORDER — PNV PRENATAL PLUS MULTIVITAMIN 27-1 MG PO TABS: ORAL_TABLET | ORAL | 12 refills | Status: DC

## 2021-03-27 MED ORDER — CHOLECALCIFEROL 125 MCG (5000 UT) PO CAPS: 5000.0000 [IU] | ORAL_CAPSULE | Freq: Every day | ORAL | Status: DC

## 2021-03-27 NOTE — Progress Notes (Signed)
Patient ID: Angela Mcclure, female   DOB: [birth year], 30 y.o.   MRN: [id number] History of Present Illness:  Angela Mcclure is a 30 year old black female,with SO, G3P3 in for a well woman gyn exam and pap. She would like to be pregnant, has been trying 3 years, has same partner, but he is on dialysis now.  PCP is Laury Axon, NP.  Current Medications, Allergies, Past Medical History, Past Surgical History, Family History and Social History were reviewed in [employer] record.     Review of Systems: Patient denies any headaches, hearing loss, fatigue, blurred vision, shortness of breath, chest pain, abdominal pain, problems with bowel movements, urination, or intercourse. No joint pain or mood swings.  Periods regular but sometimes 2 a month    Physical Exam:BP 131/83 (BP Location: Left Arm, Patient Position: Sitting, Cuff Size: Normal)   Pulse 66   Ht 5\' 5"  (1.651 m)   Wt 261 lb (118.4 kg)   LMP 03/08/2021 (Exact Date)   BMI 43.43 kg/m   General:  Well developed, well nourished, no acute distress Skin:  Warm and dry Neck:  Midline trachea, normal thyroid, good ROM, no lymphadenopathy Lungs; Clear to auscultation bilaterally Breast:  No dominant palpable mass, retraction, or nipple discharge,has bilateral nipple rods Cardiovascular: Regular rate and rhythm Abdomen:  Soft, non tender, no hepatosplenomegaly Pelvic:  External genitalia is normal in appearance, no lesions.  The vagina is normal in appearance. Urethra has no lesions or masses. The cervix is bulbous,has mucous at os.Pap with HR HPV genotyping performed.  Uterus is felt to be normal size, shape, and contour.  No adnexal masses or tenderness noted.Bladder is non tender, no masses felt. Extremities/musculoskeletal:  No swelling or varicosities noted, no clubbing or cyanosis Psych:  No mood changes, alert and cooperative,seems happy AA is 0  Fall risk is low Depression screen [facility] 2/9 03/27/2021 04/17/2020  01/25/2020  Decreased Interest 0 0 0  Down, Depressed, Hopeless 0 0 0  PHQ - 2 Score 0 0 0  Altered sleeping 0 - -  Tired, decreased energy 0 - -  Change in appetite 0 - -  Feeling bad or failure about yourself  0 - -  Trouble concentrating 0 - -  Moving slowly or fidgety/restless 0 - -  PHQ-9 Score 0 - -    GAD 7 : Generalized Anxiety Score 03/27/2021 04/17/2020  Nervous, Anxious, on Edge 0 3  Control/stop worrying 0 2  Worry too much - different things 0 2  Trouble relaxing 0 2  Restless 0 0  Easily annoyed or irritable 0 2  Afraid - awful might happen 0 3  Total GAD 7 Score 0 14  Anxiety Difficulty - Very difficult      Upstream - 03/27/21 1033       Pregnancy Intention Screening   Does the patient want to become pregnant in the next year? Yes    Does the patient's partner want to become pregnant in the next year? Yes    Would the patient like to discuss contraceptive options today? No      Contraception Wrap Up   Current Method Pregnant/Seeking Pregnancy    End Method Pregnant/Seeking Pregnancy    Contraception Counseling Provided No              Examination chaperoned by 05/27/21 LPN  Impression and Plan: 1. Encounter for gynecological examination with Papanicolaou smear of cervix Pap sent  Physical in 1 year Pap  in 3 if normal Will check labs  - Cytology - PAP - CBC - Comprehensive metabolic panel - TSH  2. Patient desires pregnancy Will check progesterone tomorrow Have sex tonight Will rx PNV  - Progesterone  Meds ordered this encounter  Medications   Prenatal Vit-Fe Fumarate-FA (PNV PRENATAL PLUS MULTIVITAMIN) 27-1 MG TABS    Sig: Take 1 daily    Dispense:  30 tablet    Refill:  12    Order Specific Question:   Supervising Provider    Answer:   Lazaro Arms [[id number]]   Cholecalciferol 125 MCG (5000 UT) capsule    Sig: Take 1 capsule (5,000 Units total) by mouth daily.    Order Specific Question:   Supervising Provider    Answer:   Lazaro Arms [[id number]]   Will talk when labs back Partner may need semen analysis    3. Vitamin D deficiency Take vitamin D 3 5000 IU daily Will check vitamin D was 8 in 2021, and not take vitamin D - VITAMIN D 25 Hydroxy (Vit-D Deficiency, Fractures)

## 2021-03-29 LAB — CYTOLOGY - PAP
Comment: NEGATIVE
Diagnosis: NEGATIVE
High risk HPV: NEGATIVE

## 2021-04-05 LAB — COMPREHENSIVE METABOLIC PANEL
ALT: 15 IU/L (ref 0–32)
AST: 14 IU/L (ref 0–40)
Albumin/Globulin Ratio: 1.6 (ref 1.2–2.2)
Albumin: 4.5 g/dL (ref 3.8–4.8)
Alkaline Phosphatase: 55 IU/L (ref 44–121)
BUN/Creatinine Ratio: 15 (ref 9–23)
BUN: 12 mg/dL (ref 6–20)
Bilirubin Total: <0.2 mg/dL (ref 0.0–1.2)
CO2: 23 mmol/L (ref 20–29)
Calcium: 9.4 mg/dL (ref 8.7–10.2)
Chloride: 103 mmol/L (ref 96–106)
Creatinine, Ser: 0.82 mg/dL (ref 0.57–1.00)
Globulin, Total: 2.9 g/dL (ref 1.5–4.5)
Glucose: 77 mg/dL (ref 70–99)
Potassium: 4.2 mmol/L (ref 3.5–5.2)
Sodium: 140 mmol/L (ref 134–144)
Total Protein: 7.4 g/dL (ref 6.0–8.5)
eGFR: 98 mL/min/{1.73_m2} (ref >59)

## 2021-04-05 LAB — CBC
Hematocrit: 34.1 % (ref 34.0–46.6)
Hemoglobin: 10.8 g/dL — ABNORMAL LOW (ref 11.1–15.9)
MCH: 22.6 pg — ABNORMAL LOW (ref 26.6–33.0)
MCHC: 31.7 g/dL (ref 31.5–35.7)
MCV: 71 fL — ABNORMAL LOW (ref 79–97)
Platelets: 259 10*3/uL (ref 150–450)
RBC: 4.78 x10E6/uL (ref 3.77–5.28)
RDW: 15.9 % — ABNORMAL HIGH (ref 11.7–15.4)
WBC: 6.5 10*3/uL (ref 3.4–10.8)

## 2021-04-05 LAB — TSH: TSH: 1.25 u[IU]/mL (ref 0.450–4.500)

## 2021-04-05 LAB — VITAMIN D 25 HYDROXY (VIT D DEFICIENCY, FRACTURES): Vit D, 25-Hydroxy: 20.6 ng/mL — ABNORMAL LOW (ref 30.0–100.0)

## 2021-04-05 LAB — PROGESTERONE: Progesterone: 0.2 ng/mL

## 2021-04-11 ENCOUNTER — Ambulatory Visit: Payer: Medicaid Other | Admitting: Family Medicine

## 2021-04-11 ENCOUNTER — Telehealth: Payer: Self-pay | Admitting: Nurse Practitioner

## 2021-04-11 ENCOUNTER — Telehealth: Payer: Medicaid Other | Admitting: Family Medicine

## 2021-04-11 ENCOUNTER — Other Ambulatory Visit: Payer: Self-pay

## 2021-04-11 VITALS — BP 130/82 | HR 78 | Ht 65.0 in | Wt 263.0 lb

## 2021-04-11 DIAGNOSIS — I10 Essential (primary) hypertension: Secondary | ICD-10-CM

## 2021-04-11 DIAGNOSIS — L03818 Cellulitis of other sites: Secondary | ICD-10-CM | POA: Diagnosis not present

## 2021-04-11 MED ORDER — DOXYCYCLINE HYCLATE 100 MG PO TABS: 100.0000 mg | ORAL_TABLET | Freq: Two times a day (BID) | ORAL | 0 refills | Status: DC

## 2021-04-11 NOTE — Telephone Encounter (Signed)
error 

## 2021-04-11 NOTE — Patient Instructions (Addendum)
Keep November appointment, call if you need to be seen sooner  Doxycycline is prescribed  Please reconsider the flu vaccine   It is important that you exercise regularly at least 30 minutes 5 times a week. If you develop chest pain, have severe difficulty breathing, or feel very tired, stop exercising immediately and seek medical attention    Thanks for choosing [facility], we consider it a privelige to serve you.

## 2021-04-14 ENCOUNTER — Encounter: Payer: Self-pay | Admitting: Family Medicine

## 2021-04-14 DIAGNOSIS — L0390 Cellulitis, unspecified: Secondary | ICD-10-CM | POA: Insufficient documentation

## 2021-04-14 NOTE — Progress Notes (Signed)
   Angela Mcclure     MRN: [id number]      DOB: February 01, 1991   HPI Angela Mcclure is here with c/o pain and purulent drainage from breast piercing on both breasts, has ahd similar problem in the past. Denies fever or chills  ROS Denies recent fever or chills. Denies sinus pressure, nasal congestion, ear pain or sore throat. Denies chest congestion, productive cough or wheezing. . Denies headaches, seizures, numbness, or tingling. Denies depression, anxiety or insomnia.  PE  BP 130/82   Pulse 78   Ht 5\' 5"  (1.651 m)   Wt 263 lb (119.3 kg)   BMI 43.77 kg/m    Patient alert and oriented and in no cardiopulmonary distress.  HEENT: No facial asymmetry, EOMI,     Neck supple .  Chest: Clear to auscultation bilaterally. Breasts: mild erythema on both brests in areas where piercing present, no purulent drainage noted at the time of exam, mildly tender  CVS: S1, S2 no murmurs, no S3.Regular rate.  ABD: Soft non tender.   Ext: No edema  MS: Adequate ROM spine, shoulders, hips and knees.  Skin: mild erythema of breasts at areas ofpiercings Psych: Good eye contact, normal affect. Memory intact not anxious or depressed appearing.  CNS: CN 2-12 intact, power,  normal throughout.no focal deficits noted.   Assessment & Plan  Cellulitis Mild infection at breasts piercing, mostly from reported history, doxycycline x 1 week prescribed  Morbid obesity (HCC)  Patient re-educated about  the importance of commitment to a  minimum of 150 minutes of exercise per week as able.  The importance of healthy food choices with portion control discussed, as well as eating regularly and within a 12 hour window most days. The need to choose "clean , green" food 50 to 75% of the time is discussed, as well as to make water the primary drink and set a goal of 64 ounces water daily.    Weight /BMI 04/11/2021 03/27/2021 04/17/2020  WEIGHT 263 lb 261 lb 245 lb  HEIGHT 5\' 5"  5\' 5"  5\' 5"   BMI 43.77 kg/m2  43.43 kg/m2 40.77 kg/m2      Hypertension Controlled, no change in medication DASH diet and commitment to daily physical activity for a minimum of 30 minutes discussed and encouraged, as a part of hypertension management. The importance of attaining a healthy weight is also discussed.  BP/Weight 04/11/2021 03/27/2021 04/17/2020 03/03/2020 01/25/2020 01/18/2020 08/24/2019  Systolic BP 130 131 126 150 118 118 120  Diastolic BP 82 83 74 80 74 74 78  Wt. (Lbs) 263 261 245 - 246 246.12 262  BMI 43.77 43.43 40.77 - 40.94 40.96 43.6

## 2021-04-14 NOTE — Assessment & Plan Note (Signed)
Controlled, no change in medication DASH diet and commitment to daily physical activity for a minimum of 30 minutes discussed and encouraged, as a part of hypertension management. The importance of attaining a healthy weight is also discussed.  BP/Weight 04/11/2021 03/27/2021 04/17/2020 03/03/2020 01/25/2020 01/18/2020 08/24/2019  Systolic BP 130 131 126 150 118 118 120  Diastolic BP 82 83 74 80 74 74 78  Wt. (Lbs) 263 261 245 - 246 246.12 262  BMI 43.77 43.43 40.77 - 40.94 40.96 43.6

## 2021-04-14 NOTE — Assessment & Plan Note (Signed)
  Patient re-educated about  the importance of commitment to a  minimum of 150 minutes of exercise per week as able.  The importance of healthy food choices with portion control discussed, as well as eating regularly and within a 12 hour window most days. The need to choose "clean , green" food 50 to 75% of the time is discussed, as well as to make water the primary drink and set a goal of 64 ounces water daily.    Weight /BMI 04/11/2021 03/27/2021 04/17/2020  WEIGHT 263 lb 261 lb 245 lb  HEIGHT 5\' 5"  5\' 5"  5\' 5"   BMI 43.77 kg/m2 43.43 kg/m2 40.77 kg/m2

## 2021-04-14 NOTE — Assessment & Plan Note (Signed)
Mild infection at breasts piercing, mostly from reported history, doxycycline x 1 week prescribed

## 2021-04-24 ENCOUNTER — Other Ambulatory Visit: Payer: Self-pay

## 2021-04-24 ENCOUNTER — Ambulatory Visit: Payer: Medicaid Other | Admitting: Nurse Practitioner

## 2021-04-24 ENCOUNTER — Encounter: Payer: Self-pay | Admitting: Nurse Practitioner

## 2021-04-24 DIAGNOSIS — D509 Iron deficiency anemia, unspecified: Secondary | ICD-10-CM

## 2021-04-24 DIAGNOSIS — E559 Vitamin D deficiency, unspecified: Secondary | ICD-10-CM | POA: Diagnosis not present

## 2021-04-24 MED ORDER — CHOLECALCIFEROL 125 MCG (5000 UT) PO CAPS: 5000.0000 [IU] | ORAL_CAPSULE | Freq: Every day | ORAL | Status: DC

## 2021-04-24 NOTE — Assessment & Plan Note (Addendum)
Vitamin D 5000 units daily reordered.

## 2021-04-24 NOTE — Assessment & Plan Note (Signed)
Continue prenatal  Vit-Fe t daily.

## 2021-04-24 NOTE — Patient Instructions (Signed)
It is important that you exercise regularly at least 30 minutes 5 times a week. If you develop chest pain, have severe difficulty breathing, or feel very tired, stop exercising immediately and seek medical attention.  Think about what you will eat, plan ahead. Choose " clean, green, fresh or frozen" over canned, processed or packaged foods which are more sugary, salty and fatty. 70 to 75% of food eaten should be vegetables and fruit. Three meals at set times with snacks allowed between meals, but they must be fruit or vegetables. Aim to eat over a 12 hour period , example 7 am to 7 pm, and STOP after  your last meal of the day. Drink water,generally about 64 ounces per day, no other drink is as healthy. Fruit juice is best enjoyed in a healthy way, by EATING the fruit.  Thanks for choosing [facility], we consider it a privelige to serve you.

## 2021-04-24 NOTE — Assessment & Plan Note (Signed)
Maintain a healthy weight. Avoid foods that contain a lot of saturated and trans fat such as red meat, butter, fried foods and cheese. Eat a healthy diet,including lots of fruits and vegetables. Maintain a healthy weight.  Every week do at least 150 minutes of moderate physical activity such as brisk walking, or 75 minutes of vigorous activities like jogging or an equivalent combination of the two. Two or more days a week do strength training activities like  lifting weights or using a resistance band that involve all major muscle groups. Stop exercise and rest  if you develop chest pain or shortness of breath. Call 911 if symptom is not relived after rest.   Eat high protein,fruits and vegetables. Lower carb meals to control hunger and appetite. Avoid or eat fewer carbs like bread, pasta, rice, deserts, sugary beverages and juice.  Choose carbs that are higher in fiber and lower in added sugar fr example say yes to beans and sweet potatoes, say no to sugary drinks and chips.

## 2021-04-24 NOTE — Progress Notes (Signed)
Established Patient Office Visit  Subjective:  Patient ID: Angela Mcclure, female    DOB: Feb 23, 1991  Age: 30 y.o. MRN: [id number]  CC:  Chief Complaint  Patient presents with   Follow-up    Follow up, has had mucus in her stool. ? Insulin resistance- changes in color on her neck. Has also felt faint    HPI Angela KOO presents for follow up . Previous complaints of breast pain/ cellulitis now resolved.   Past Medical History:  Diagnosis Date   Anxiety    Asthma    History of gestational hypertension 10/18/2013   Postpartum hypertension 05/14/2015    Past Surgical History:  Procedure Laterality Date   NO PAST SURGERIES      Family History  Problem Relation Age of Onset   Diabetes Other    Hypertension Other    Stroke Other    Diabetes Mother    Hypertension Mother     Social History   Socioeconomic History   Marital status: Significant Other    Spouse name: Not on file   Number of children: 3   Years of education: Not on file   Highest education level: 12th grade  Occupational History   Occupation: Aided    Comment: [place]   Tobacco Use   Smoking status: Never   Smokeless tobacco: Never  Vaping Use   Vaping Use: Never used  Substance and Sexual Activity   Alcohol use: No   Drug use: No   Sexual activity: Yes    Birth control/protection: None    Comment: has on esexyal partner  Other Topics Concern   Not on file  Social History Narrative   Lives with 3 children father of children    4- [name]    5- [name]   6- [name]       Enjoys: shopping, eating out, relaxing, watching movies       Diet: eats all food groups   Caffeine: sweet tea    Water: at times 16 oz       Wears seat belt   Smoke and carbon monoxide detectors    Does not use phone while driving       Social Determinants of Radio broadcast assistant Strain: Low Risk    Difficulty of Paying Living Expenses: Not hard at all  Food Insecurity: No Food  Insecurity   Worried About Charity fundraiser in the Last Year: Never true   Arboriculturist in the Last Year: Never true  Transportation Needs: No Transportation Needs   Lack of Transportation (Medical): No   Lack of Transportation (Non-Medical): No  Physical Activity: Insufficiently Active   Days of Exercise per Week: 3 days   Minutes of Exercise per Session: 20 min  Stress: No Stress Concern Present   Feeling of Stress : Not at all  Social Connections: Socially Isolated   Frequency of Communication with Friends and Family: More than three times a week   Frequency of Social Gatherings with Friends and Family: Three times a week   Attends Religious Services: Never   Active Member of Clubs or Organizations: No   Attends Music therapist: Patient refused   Marital Status: Never married  Human resources officer Violence: Not At Risk   Fear of Current or Ex-Partner: No   Emotionally Abused: No   Physically Abused: No   Sexually Abused: No    Outpatient Medications Prior to  Visit  Medication Sig Dispense Refill   Prenatal Vit-Fe Fumarate-FA (PNV PRENATAL PLUS MULTIVITAMIN) 27-1 MG TABS Take 1 daily (Patient not taking: Reported on 04/24/2021) 30 tablet 12   Cholecalciferol 125 MCG (5000 UT) capsule Take 1 capsule (5,000 Units total) by mouth daily. (Patient not taking: Reported on 04/24/2021)     doxycycline (VIBRA-TABS) 100 MG tablet Take 1 tablet (100 mg total) by mouth 2 (two) times daily. 14 tablet 0   No facility-administered medications prior to visit.    No Known Allergies  ROS Review of Systems  Constitutional: Negative.   Respiratory: Negative.    Cardiovascular: Negative.   Psychiatric/Behavioral: Negative.       Objective:    Physical Exam Cardiovascular:     Rate and Rhythm: Normal rate and regular rhythm.     Pulses: Normal pulses.  Pulmonary:     Effort: Pulmonary effort is normal.     Breath sounds: Normal breath sounds.  Abdominal:      Palpations: Abdomen is soft.     Comments: Obese.   Musculoskeletal:        General: Normal range of motion.  Skin:    General: Skin is warm and dry.  Neurological:     Mental Status: She is oriented to person, place, and time.  Psychiatric:        Mood and Affect: Mood normal.        Behavior: Behavior normal.        Thought Content: Thought content normal.        Judgment: Judgment normal.    BP 124/82 (BP Location: Right Arm, Patient Position: Sitting, Cuff Size: Normal)   Pulse 70   Ht $R'5\' 5"'oV$  (1.651 m)   Wt 256 lb (116.1 kg)   LMP 04/03/2021 (Exact Date)   SpO2 97%   BMI 42.60 kg/m  Wt Readings from Last 3 Encounters:  04/24/21 256 lb (116.1 kg)  04/14/21 263 lb (119.3 kg)  03/27/21 261 lb (118.4 kg)     Health Maintenance Due  Topic Date Due   Hepatitis C Screening  Never done   Pneumococcal Vaccine 34-21 Years old (2 - PCV) 08/27/2013    There are no preventive care reminders to display for this patient.  Lab Results  Component Value Date   TSH 1.250 04/04/2021   Lab Results  Component Value Date   WBC 6.5 04/04/2021   HGB 10.8 (L) 04/04/2021   HCT 34.1 04/04/2021   MCV 71 (L) 04/04/2021   PLT 259 04/04/2021   Lab Results  Component Value Date   NA 140 04/04/2021   K 4.2 04/04/2021   CO2 23 04/04/2021   GLUCOSE 77 04/04/2021   BUN 12 04/04/2021   CREATININE 0.82 04/04/2021   BILITOT <0.2 04/04/2021   ALKPHOS 55 04/04/2021   AST 14 04/04/2021   ALT 15 04/04/2021   PROT 7.4 04/04/2021   ALBUMIN 4.5 04/04/2021   CALCIUM 9.4 04/04/2021   ANIONGAP 10 07/24/2016   EGFR 98 04/04/2021   No results found for: CHOL No results found for: HDL No results found for: LDLCALC No results found for: TRIG No results found for: CHOLHDL Lab Results  Component Value Date   HGBA1C 5.5 07/18/2019      Assessment & Plan:   Problem List Items Addressed This Visit       Other   Morbid obesity ([place]) - Primary    Maintain a healthy weight. Avoid foods  that contain a lot of saturated  and trans fat such as red meat, butter, fried foods and cheese. Eat a healthy diet,including lots of fruits and vegetables. Maintain a healthy weight.  Every week do at least 150 minutes of moderate physical activity such as brisk walking, or 75 minutes of vigorous activities like jogging or an equivalent combination of the two. Two or more days a week do strength training activities like  lifting weights or using a resistance band that involve all major muscle groups. Stop exercise and rest  if you develop chest pain or shortness of breath. Call 911 if symptom is not relived after rest.   Eat high protein,fruits and vegetables. Lower carb meals to control hunger and appetite. Avoid or eat fewer carbs like bread, pasta, rice, deserts, sugary beverages and juice.  Choose carbs that are higher in fiber and lower in added sugar fr example say yes to beans and sweet potatoes, say no to sugary drinks and chips.       Vitamin D deficiency    Vitamin D 5000 units daily reordered.       Iron deficiency anemia    Continue prenatal  Vit-Fe t daily.        Meds ordered this encounter  Medications   Cholecalciferol 125 MCG (5000 UT) capsule    Sig: Take 1 capsule (5,000 Units total) by mouth daily.    Follow-up: No follow-ups on file.    Renee Rival, FNP

## 2021-05-15 ENCOUNTER — Ambulatory Visit
Admission: EM | Admit: 2021-05-15 | Discharge: 2021-05-15 | Discharge status: Against Medical Advice | Payer: Medicaid Other

## 2021-05-15 ENCOUNTER — Other Ambulatory Visit: Payer: Self-pay

## 2021-05-15 NOTE — ED Notes (Signed)
This patient was called on the phone 4 times by Urgent Care staff with no response. Patient was called 2 times in the lobby with no response. Patient didn't notify Urgent Care staff on leaving.

## 2021-07-12 ENCOUNTER — Ambulatory Visit: Payer: Medicaid Other | Admitting: Nurse Practitioner

## 2021-07-12 ENCOUNTER — Other Ambulatory Visit: Payer: Self-pay

## 2021-07-12 VITALS — BP 133/84 | HR 71 | Resp 16 | Ht 65.0 in | Wt 258.0 lb

## 2021-07-12 DIAGNOSIS — N63 Unspecified lump in unspecified breast: Secondary | ICD-10-CM

## 2021-07-12 DIAGNOSIS — N61 Mastitis without abscess: Secondary | ICD-10-CM | POA: Insufficient documentation

## 2021-07-12 DIAGNOSIS — S21039A Puncture wound without foreign body of unspecified breast, initial encounter: Secondary | ICD-10-CM

## 2021-07-12 DIAGNOSIS — E559 Vitamin D deficiency, unspecified: Secondary | ICD-10-CM

## 2021-07-12 MED ORDER — SULFAMETHOXAZOLE-TRIMETHOPRIM 800-160 MG PO TABS: 1.0000 | ORAL_TABLET | Freq: Two times a day (BID) | ORAL | 0 refills | Status: AC

## 2021-07-12 MED ORDER — VITAMIN D3 25 MCG (1000 UT) PO CAPS: 1000.0000 [IU] | ORAL_CAPSULE | Freq: Every day | ORAL | 3 refills | Status: DC

## 2021-07-12 NOTE — Assessment & Plan Note (Signed)
She has not been taking vitamin D precribed, pt told to take vitamin d 1000 units  Daily.

## 2021-07-12 NOTE — Patient Instructions (Addendum)
Take vitamin d 1000 units daily for low vitamin d  Take bactrim DS two times daily for 7 days for your infected nipple.    It is important that you exercise regularly at least 30 minutes 5 times a week.  Think about what you will eat, plan ahead. Choose " clean, green, fresh or frozen" over canned, processed or packaged foods which are more sugary, salty and fatty. 70 to 75% of food eaten should be vegetables and fruit. Three meals at set times with snacks allowed between meals, but they must be fruit or vegetables. Aim to eat over a 12 hour period , example 7 am to 7 pm, and STOP after  your last meal of the day. Drink water,generally about 64 ounces per day, no other drink is as healthy. Fruit juice is best enjoyed in a healthy way, by EATING the fruit.  Thanks for choosing [facility], we consider it a privelige to serve you.

## 2021-07-12 NOTE — Assessment & Plan Note (Addendum)
Nipple and breast tissue  non tender, no discoloration noted, no abnormal tissue palpated upon examination, no lymphadenopathy. Skin warm and dry.   has piercing in place.  Patient told to consider  Removing  piercing to prevent infection.

## 2021-07-12 NOTE — Assessment & Plan Note (Signed)
Septra ds bid for 7 days. Patient told to keep area clean and dry and consider removing piercing to prevent further infection. Use Tylenol as needed for pain, follow-up in 4 weeks.  Keep site clean and dry

## 2021-07-12 NOTE — Progress Notes (Addendum)
° °  Angela Mcclure     MRN: [id number]      DOB: 06-14-1991   HPI Angela Mcclure is here for complaints of mass under her left nipple  noticed the lump 2 days ago. Denies pain on the left breast, never had this problem before.   Pt c/o of Right breast infection from piercing, states that her nipple has a little pus and its painful, She had her right nipple pierced about 2 years ago. Started having pain with pus like drainage  8 days ago. She has not used any medication for her infection and pain.   Last menstrual period was on 07/01/21, states that she has regular menses.   ROS Denies recent fever or chills. Denies sinus pressure, nasal congestion, ear pain or sore throat. Denies chest congestion, productive cough or wheezing. Denies chest pains, palpitations and leg swelling Denies abdominal pain, nausea, vomiting,diarrhea or constipation.  Has infected right nipple, mass on left nipple.    PE    Angela Mcclure present as chaperone  BP 133/84    Pulse 71    Resp 16    Ht 5\' 5"  (1.651 m)    Wt 258 lb 0.6 oz (117 kg)    SpO2 97%    BMI 42.94 kg/m   Patient alert and oriented and in no cardiopulmonary distress.  Chest: Clear to auscultation bilaterally.  CVS: S1, S2 no murmurs, no S3.Regular rate.  ABD: Soft non tender.   Ext: No edema  MS: Adequate ROM spine, shoulders, hips and knees.  Skin: right nipple tender to touch ,no drainage noted, no redness, has piercing on both nipple. Left nipple examined, no tenderness, no skin discoloration, no drainage no abnormal tissue palpated.  No lymphadenopathy noted bilaterally.   Psych: Good eye contact, normal affect. Memory intact not anxious or depressed appearing.  CNS: CN 2-12 intact, power,  normal throughout.no focal deficits noted.   Assessment & Plan

## 2021-08-07 ENCOUNTER — Other Ambulatory Visit: Payer: Self-pay

## 2021-08-07 ENCOUNTER — Ambulatory Visit
Admission: RE | Admit: 2021-08-07 | Discharge: 2021-08-07 | Disposition: A | Discharge status: Home / Self Care | Payer: Medicaid Other | Source: Ambulatory Visit | Attending: Family Medicine | Admitting: Family Medicine

## 2021-08-07 VITALS — BP 156/86 | HR 74 | Temp 98.7°F | Resp 18

## 2021-08-07 DIAGNOSIS — L089 Local infection of the skin and subcutaneous tissue, unspecified: Secondary | ICD-10-CM

## 2021-08-07 DIAGNOSIS — B9689 Other specified bacterial agents as the cause of diseases classified elsewhere: Secondary | ICD-10-CM

## 2021-08-07 MED ORDER — DOXYCYCLINE HYCLATE 100 MG PO CAPS: 100.0000 mg | ORAL_CAPSULE | Freq: Two times a day (BID) | ORAL | 0 refills | Status: DC

## 2021-08-07 NOTE — ED Triage Notes (Signed)
Pt reports swelling, pain and discharge in right nipple x 2-3 days. States she had a piercingthe right nipple x 2 years

## 2021-08-07 NOTE — ED Provider Notes (Signed)
[place]   [id number] 08/07/21 Arrival Time: [id number]  ASSESSMENT & PLAN:  1. Localized bacterial skin infection    No signs of abscess formation. Begin: Meds ordered this encounter  Medications   doxycycline (VIBRAMYCIN) 100 MG capsule    Sig: Take 1 capsule (100 mg total) by mouth 2 (two) times daily.    Dispense:  14 capsule    Refill:  0   Will remove nipple piercing.  Will follow up with PCP or here if worsening or failing to improve as anticipated. Reviewed expectations re: course of current medical issues. Questions answered. Outlined signs and symptoms indicating need for more acute intervention. Patient verbalized understanding. After Visit Summary given.   SUBJECTIVE:  Angela Mcclure is a 31 y.o. female who presents with a skin complaint. Questions "infected nipple piercing"; piercing present x 2 years; reports some drainage over past sev days. Afebrile. Is tender.   OBJECTIVE: Vitals:   08/07/21 1417  BP: (!) 156/86  Pulse: 74  Resp: 18  Temp: 98.7 F (37.1 C)  TempSrc: Oral  SpO2: 99%    General appearance: alert; no distress HEENT: [place]; AT Skin: warm and dry; (chaperone present) R nipple is very TPP with mild overlying erythema; no active drainage; no areas of fluctuance Psychological: alert and cooperative; normal mood and affect  No Known Allergies  Past Medical History:  Diagnosis Date   Anxiety    Asthma    History of gestational hypertension 10/18/2013   Postpartum hypertension 05/14/2015   Social History   Socioeconomic History   Marital status: Significant Other    Spouse name: Not on file   Number of children: 3   Years of education: Not on file   Highest education level: 12th grade  Occupational History   Occupation: Aided    Comment: [employer]   Tobacco Use   Smoking status: Never   Smokeless tobacco: Never  Vaping Use   Vaping Use: Never used  Substance and Sexual Activity   Alcohol use: No   Drug  use: Never   Sexual activity: Yes    Birth control/protection: None    Comment: has on esexyal partner  Other Topics Concern   Not on file  Social History Narrative   Lives with 3 children father of children    4- [name]    5- [name]   6- [name]       Enjoys: shopping, eating out, relaxing, watching movies       Diet: eats all food groups   Caffeine: sweet tea    Water: at times 16 oz       Wears seat belt   Smoke and carbon monoxide detectors    Does not use phone while driving       Social Determinants of Radio broadcast assistant Strain: Low Risk    Difficulty of Paying Living Expenses: Not hard at all  Food Insecurity: No Food Insecurity   Worried About Charity fundraiser in the Last Year: Never true   Arboriculturist in the Last Year: Never true  Transportation Needs: No Transportation Needs   Lack of Transportation (Medical): No   Lack of Transportation (Non-Medical): No  Physical Activity: Insufficiently Active   Days of Exercise per Week: 3 days   Minutes of Exercise per Session: 20 min  Stress: No Stress Concern Present   Feeling of Stress : Not at all  Social Connections: Socially Isolated  Frequency of Communication with Friends and Family: More than three times a week   Frequency of Social Gatherings with Friends and Family: Three times a week   Attends Religious Services: Never   Active Member of Clubs or Organizations: No   Attends Music therapist: Patient refused   Marital Status: Never married  Human resources officer Violence: Not At Risk   Fear of Current or Ex-Partner: No   Emotionally Abused: No   Physically Abused: No   Sexually Abused: No   Family History  Problem Relation Age of Onset   Diabetes Other    Hypertension Other    Stroke Other    Diabetes Mother    Hypertension Mother    Past Surgical History:  Procedure Laterality Date   NO PAST SURGERIES        Vanessa Kick, MD 08/07/21 1430

## 2021-08-09 ENCOUNTER — Ambulatory Visit: Payer: Medicaid Other | Admitting: Nurse Practitioner

## 2021-08-19 ENCOUNTER — Other Ambulatory Visit: Payer: Self-pay

## 2021-08-19 ENCOUNTER — Encounter: Payer: Self-pay | Admitting: Adult Health

## 2021-08-19 ENCOUNTER — Ambulatory Visit (INDEPENDENT_AMBULATORY_CARE_PROVIDER_SITE_OTHER): Payer: Medicaid Other | Admitting: Adult Health

## 2021-08-19 VITALS — BP 129/74 | HR 72 | Ht 65.0 in | Wt 261.0 lb

## 2021-08-19 DIAGNOSIS — N920 Excessive and frequent menstruation with regular cycle: Secondary | ICD-10-CM

## 2021-08-19 DIAGNOSIS — N6323 Unspecified lump in the left breast, lower outer quadrant: Secondary | ICD-10-CM | POA: Insufficient documentation

## 2021-08-19 NOTE — Progress Notes (Signed)
?  Subjective:  ?  ? Patient ID: Angela Mcclure, female   DOB: 10/03/1990, 31 y.o.   MRN: [id number] ? ?HPI ?Angela Mcclure is a 31 year old black female,single, G3P3 in complaining of left breast mass, at nipple, and it is tender.  ?Also periods heavy at first and having clots, they are regular. ? ?Lab Results  ?Component Value Date  ? DIAGPAP  03/27/2021  ?  - Negative for intraepithelial lesion or malignancy (NILM)  ? [place] Negative 03/27/2021  ?  ? ?Review of Systems ?Left breast mass near nipple, tender for about a month ?Periods heavy at first and having clots ?Reviewed past medical,surgical, social and family history. Reviewed medications and allergies.  ?   ?Objective:  ? Physical Exam ?BP 129/74 (BP Location: Right Arm, Patient Position: Sitting, Cuff Size: Normal)   Pulse 72   Ht 5\' 5"  ([id number] m)   Wt 261 lb (118.4 kg)   LMP 08/14/2021 (Exact Date)   BMI 43.43 kg/m?   ?   Skin warm and dry,  Breasts:no dominate palpable mass, retraction or nipple discharge on right, on left no retraction or nipple discharge, has tender, round mass in areola at nipple at 4 0' clock. ? ? [place] - 08/19/21 1614   ? ?  ? Pregnancy Intention Screening  ? Does the patient want to become pregnant in the next year? Ok Either Way   ? Does the patient's partner want to become pregnant in the next year? Ok Either Way   ? Would the patient like to discuss contraceptive options today? No   ?  ? Contraception Wrap Up  ? Current Method No Method - Other Reason   ? End Method No Method - Other Reason   ? Contraception Counseling Provided No   ? ?  ?  ? ?  ?  ?Assessment:  ?   ?1. Mass of lower outer quadrant of left breast ?Diagnostic mammogram and [place] [facility] at [facility] 09/[facility] at 9 am to assess mass  ?- US BREAST LTD UNI RIGHT INC AXILLA; Future ?- MM DIAG BREAST TOMO BILATERAL; Future ?- US BREAST LTD UNI LEFT INC AXILLA; Future ? ?2. Menorrhagia with regular cycle ?Return in about 3 weeks for GYN [place] in office to assess uterus and  ovaries  ?- US PELVIC COMPLETE WITH TRANSVAGINAL; Future  ?   ?Plan:  ?   ?Will talk when results back ?   ?

## 2021-09-05 ENCOUNTER — Ambulatory Visit (HOSPITAL_COMMUNITY)
Admission: RE | Admit: 2021-09-05 | Discharge: 2021-09-05 | Disposition: A | Discharge status: Home / Self Care | Payer: Medicaid Other | Source: Ambulatory Visit | Attending: Adult Health | Admitting: Adult Health

## 2021-09-05 ENCOUNTER — Other Ambulatory Visit: Payer: Self-pay

## 2021-09-05 DIAGNOSIS — N6323 Unspecified lump in the left breast, lower outer quadrant: Secondary | ICD-10-CM

## 2021-09-12 ENCOUNTER — Ambulatory Visit (INDEPENDENT_AMBULATORY_CARE_PROVIDER_SITE_OTHER): Payer: Medicaid Other

## 2021-09-12 DIAGNOSIS — N920 Excessive and frequent menstruation with regular cycle: Secondary | ICD-10-CM

## 2021-09-12 NOTE — Progress Notes (Signed)
PELVIC US TA/TV:homogeneous anteverted uterus,WNL,normal ovaries,ovaries appear mobile,EEC 11.6 mm,no free fluid,no pain during ultrasound ? ?Chaperone Tish ?

## 2021-09-13 ENCOUNTER — Encounter: Payer: Self-pay | Admitting: Adult Health

## 2021-10-29 ENCOUNTER — Ambulatory Visit: Payer: Medicaid Other | Admitting: Nurse Practitioner

## 2021-11-20 ENCOUNTER — Other Ambulatory Visit (HOSPITAL_COMMUNITY): Payer: Self-pay | Admitting: Adult Health

## 2021-11-20 DIAGNOSIS — N6320 Unspecified lump in the left breast, unspecified quadrant: Secondary | ICD-10-CM

## 2021-11-23 ENCOUNTER — Encounter: Payer: Self-pay | Admitting: Emergency Medicine

## 2021-11-23 ENCOUNTER — Ambulatory Visit
Admission: EM | Admit: 2021-11-23 | Discharge: 2021-11-23 | Disposition: A | Discharge status: Home / Self Care | Payer: Medicaid Other | Attending: Nurse Practitioner | Admitting: Nurse Practitioner

## 2021-11-23 DIAGNOSIS — B9689 Other specified bacterial agents as the cause of diseases classified elsewhere: Secondary | ICD-10-CM

## 2021-11-23 DIAGNOSIS — N61 Mastitis without abscess: Secondary | ICD-10-CM | POA: Diagnosis not present

## 2021-11-23 MED ORDER — DOXYCYCLINE HYCLATE 100 MG PO TABS: 100.0000 mg | ORAL_TABLET | Freq: Two times a day (BID) | ORAL | 0 refills | Status: AC

## 2021-11-23 NOTE — ED Provider Notes (Signed)
[facility]    CSN: [id number] Arrival date & time: 11/23/21  0854      History   Chief Complaint Chief Complaint  Patient presents with   Breast Pain    HPI Angela Mcclure is a 31 y.o. female.   The history is provided by the patient.   Presents for complaints of left breast pain that has been present for the past 4 days.  Patient states she was wrestling with her son and since that time she has felt a knot in the breast.  The area of the breast is tender and firm.  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  She states that she had a previous piercing there, and has had to be treated since it became infected.  She states she was last treated approximately 6 weeks ago for the same or similar symptoms.  Patient denies fever, chills, drainage, abdominal pain, nausea or vomiting.  Patient states she does have an upcoming mammogram.  Past Medical History:  Diagnosis Date   Anxiety    Asthma    History of gestational hypertension 10/18/2013   Postpartum hypertension 05/14/2015    Patient Active Problem List   Diagnosis Date Noted   Menorrhagia with regular cycle 08/19/2021   Mass of lower outer quadrant of left breast 08/19/2021   Infected pierced nipple 07/12/2021   Mass of nipple 07/12/2021   Cellulitis 04/14/2021   Anxiety 04/17/2020   Nipple infection in female 01/18/2020   Iron deficiency anemia 08/24/2019   Fatigue 07/12/2019   Family history of diabetes mellitus in mother 07/12/2019   Vitamin D deficiency 07/12/2019   Hypertension 07/12/2019   Morbid obesity (HCC) 10/18/2013   Asthma 08/18/2012    Past Surgical History:  Procedure Laterality Date   NO PAST SURGERIES      OB History     Gravida  3   Para  3   Term  3   Preterm      AB      Living  3      SAB      IAB      Ectopic      Multiple  0   Live Births  3            Home Medications    Prior to Admission medications   Medication Sig  Start Date End Date Taking? Authorizing Provider  doxycycline (VIBRA-TABS) 100 MG tablet Take 1 tablet (100 mg total) by mouth 2 (two) times daily for 10 days. 11/23/21 12/03/21 Yes Malarie Tappen-Warren, Sadie Haber, NP  Cholecalciferol (VITAMIN D3) 25 MCG (1000 UT) CAPS Take 1 capsule (1,000 Units total) by mouth daily. Patient not taking: Reported on 08/19/2021 07/12/21   Donell Beers, FNP  Prenatal Vit-Fe Fumarate-FA (PNV PRENATAL PLUS MULTIVITAMIN) 27-1 MG TABS Take 1 daily Patient not taking: Reported on 04/24/2021 03/27/21   Adline Potter, NP    Family History Family History  Problem Relation Age of Onset   Diabetes Other    Hypertension Other    Stroke Other    Diabetes Mother    Hypertension Mother     Social History Social History   Tobacco Use   Smoking status: Never   Smokeless tobacco: Never  Vaping Use   Vaping Use: Never used  Substance Use Topics   Alcohol use: No   Drug use: Never     Allergies   Patient has no known allergies.   Review  of Systems Review of Systems Per HPI  Physical Exam Triage Vital Signs ED Triage Vitals  Enc Vitals Group     BP 11/23/21 0930 125/86     Pulse Rate 11/23/21 0930 76     Resp 11/23/21 0930 18     Temp 11/23/21 0930 99.1 F (37.3 C)     Temp Source 11/23/21 0930 Oral     SpO2 11/23/21 0930 98 %     Weight --      Height --      Head Circumference --      Peak Flow --      Pain Score 11/23/21 0927 10     Pain Loc --      Pain Edu? --      Excl. in GC? --    No data found.  Updated Vital Signs BP 125/86 (BP Location: Right Arm)   Pulse 76   Temp 99.1 F (37.3 C) (Oral)   Resp 18   LMP 10/30/2021   SpO2 98%   Visual Acuity Right Eye Distance:   Left Eye Distance:   Bilateral Distance:    Right Eye Near:   Left Eye Near:    Bilateral Near:     Physical Exam Vitals reviewed.  Constitutional:      General: She is not in acute distress.    Appearance: She is well-developed.  HENT:     Head:  Normocephalic.  Eyes:     Conjunctiva/sclera: Conjunctivae normal.     Pupils: Pupils are equal, round, and reactive to light.  Cardiovascular:     Rate and Rhythm: Normal rate and regular rhythm.     Heart sounds: Normal heart sounds.  Pulmonary:     Effort: Pulmonary effort is normal.     Breath sounds: Normal breath sounds.  Chest:  Breasts:    Right: Normal.     Left: Swelling and tenderness present. No bleeding, inverted nipple or nipple discharge.       Comments: Firmness noted to the areola of the left breast.  The area is tender to palpation.  Area extends to the entire areola.  There is no fluctuance, drainage, or erythema present at this time Abdominal:     General: Bowel sounds are normal. There is no distension.     Palpations: Abdomen is soft.     Tenderness: There is no abdominal tenderness. There is no guarding or rebound.  Genitourinary:    Vagina: Normal. No vaginal discharge.  Musculoskeletal:     Cervical back: Normal range of motion.  Skin:    General: Skin is warm and dry.     Findings: No erythema or rash.  Neurological:     Mental Status: She is alert and oriented to person, place, and time.     Cranial Nerves: No cranial nerve deficit.  Psychiatric:        Behavior: Behavior normal.      UC Treatments / Results  Labs (all labs ordered are listed, but only abnormal results are displayed) Labs Reviewed - No data to display  EKG   Radiology No results found.  Procedures Procedures (including critical care time)  Medications Ordered in UC Medications - No data to display  Initial Impression / Assessment and Plan / UC Course  I have reviewed the triage vital signs and the nursing notes.  Pertinent labs & imaging results that were available during my care of the patient were reviewed by me and considered in my medical  decision making (see chart for details).  Patient presents with swelling and pain to the left areola that has been present  for the past 4 days.  On exam, the area is firm and tender to palpation.  There is no presence of fluctuance or drainage or erythema at time.  Given her history of recurrent nipple infection, cannot rule that out at this time although it may be the injury that was caused after playing with her son.  We will treat prophylactically with doxycycline at this time.  Supportive care recommendations were provided to the patient.  Outlined signs and symptoms indicating need for more acute intervention. Patient advised to follow-up with her primary care physician if symptoms do not improve. Final Clinical Impressions(s) / UC Diagnoses   Final diagnoses:  Localized bacterial skin infection     Discharge Instructions      Take medication as prescribed. Apply warm compresses to the left breast 3-4 times daily until symptoms improve. Keep the area clean and dry.  Use Dial Gold bar soap to cleanse the area at least twice daily to help prevent spread of bacteria. If symptoms do not improve, please follow-up with your primary care physician for further evaluation.     ED Prescriptions     Medication Sig Dispense Auth. Provider   doxycycline (VIBRA-TABS) 100 MG tablet Take 1 tablet (100 mg total) by mouth 2 (two) times daily for 10 days. 20 tablet Katai Marsico-Warren, Sadie Haber, NP      PDMP not reviewed this encounter.   Abran Cantor, NP 11/23/21 1038

## 2021-11-23 NOTE — ED Triage Notes (Signed)
Left breast pain since Tuesday.  Thinks child hit breast while wrestling.  States she feels a knot in her breast.

## 2021-11-23 NOTE — Discharge Instructions (Addendum)
Take medication as prescribed. Apply warm compresses to the left breast 3-4 times daily until symptoms improve. Keep the area clean and dry.  Use Dial Gold bar soap to cleanse the area at least twice daily to help prevent spread of bacteria. If symptoms do not improve, please follow-up with your primary care physician for further evaluation.

## 2021-12-05 ENCOUNTER — Ambulatory Visit (HOSPITAL_COMMUNITY)
Admission: RE | Admit: 2021-12-05 | Discharge: 2021-12-05 | Disposition: A | Discharge status: Home / Self Care | Payer: Medicaid Other | Source: Ambulatory Visit | Attending: Adult Health | Admitting: Adult Health

## 2021-12-05 ENCOUNTER — Telehealth: Payer: Self-pay | Admitting: Adult Health

## 2021-12-05 DIAGNOSIS — N6320 Unspecified lump in the left breast, unspecified quadrant: Secondary | ICD-10-CM

## 2021-12-05 MED ORDER — AMOXICILLIN-POT CLAVULANATE 875-125 MG PO TABS: 1.0000 | ORAL_TABLET | Freq: Two times a day (BID) | ORAL | 0 refills | Status: DC

## 2021-12-05 NOTE — Telephone Encounter (Signed)
Left message that I spoke with radiologist today and he wanted her to have antibiotic will rx Augmentin at [facility] and get follow up US in 2 weeks

## 2021-12-15 ENCOUNTER — Other Ambulatory Visit: Payer: Self-pay

## 2021-12-15 ENCOUNTER — Ambulatory Visit
Admission: RE | Admit: 2021-12-15 | Discharge: 2021-12-15 | Disposition: A | Discharge status: Home / Self Care | Payer: Medicaid Other | Source: Ambulatory Visit | Attending: Family Medicine | Admitting: Family Medicine

## 2021-12-15 VITALS — BP 130/80 | HR 82 | Temp 98.3°F | Resp 20

## 2021-12-15 DIAGNOSIS — N611 Abscess of the breast and nipple: Secondary | ICD-10-CM | POA: Diagnosis not present

## 2021-12-15 MED ORDER — SULFAMETHOXAZOLE-TRIMETHOPRIM 800-160 MG PO TABS: 1.0000 | ORAL_TABLET | Freq: Two times a day (BID) | ORAL | 0 refills | Status: AC

## 2021-12-15 NOTE — ED Triage Notes (Signed)
Pt reports "knot" beside nipple since Friday and reports noted drainage from site last night in shower. Denies fever, chills,nausea.

## 2021-12-15 NOTE — ED Provider Notes (Signed)
[facility]    CSN: [id number] Arrival date & time: 12/15/21  1300      History   Chief Complaint Chief Complaint  Patient presents with   Abscess    Left breast abscess - Entered by patient    HPI Angela Mcclure is a 31 y.o. female.   Presenting today with a painful mass beside her left nipple medially that has been present for several days, just started draining this morning in the shower.  She denies fever, chills, nausea, vomiting, injury to the area.  States she had similar issues in the past and was given Augmentin 4 days ago for the same which she has been taking.  She does not feel like its been helping thus far.    Past Medical History:  Diagnosis Date   Anxiety    Asthma    History of gestational hypertension 10/18/2013   Postpartum hypertension 05/14/2015    Patient Active Problem List   Diagnosis Date Noted   Menorrhagia with regular cycle 08/19/2021   Mass of lower outer quadrant of left breast 08/19/2021   Infected pierced nipple 07/12/2021   Mass of nipple 07/12/2021   Cellulitis 04/14/2021   Anxiety 04/17/2020   Nipple infection in female 01/18/2020   Iron deficiency anemia 08/24/2019   Fatigue 07/12/2019   Family history of diabetes mellitus in mother 07/12/2019   Vitamin D deficiency 07/12/2019   Hypertension 07/12/2019   Morbid obesity (HCC) 10/18/2013   Asthma 08/18/2012    Past Surgical History:  Procedure Laterality Date   NO PAST SURGERIES      OB History     Gravida  3   Para  3   Term  3   Preterm      AB      Living  3      SAB      IAB      Ectopic      Multiple  0   Live Births  3            Home Medications    Prior to Admission medications   Medication Sig Start Date End Date Taking? Authorizing Provider  sulfamethoxazole-trimethoprim (BACTRIM DS) 800-160 MG tablet Take 1 tablet by mouth 2 (two) times daily for 7 days. 12/15/21 12/22/21 Yes Particia Nearing, PA-C   amoxicillin-clavulanate (AUGMENTIN) 875-125 MG tablet Take 1 tablet by mouth 2 (two) times daily. 12/05/21   Adline Potter, NP  Cholecalciferol (VITAMIN D3) 25 MCG (1000 UT) CAPS Take 1 capsule (1,000 Units total) by mouth daily. Patient not taking: Reported on 08/19/2021 07/12/21   Donell Beers, FNP  Prenatal Vit-Fe Fumarate-FA (PNV PRENATAL PLUS MULTIVITAMIN) 27-1 MG TABS Take 1 daily Patient not taking: Reported on 04/24/2021 03/27/21   Adline Potter, NP    Family History Family History  Problem Relation Age of Onset   Diabetes Other    Hypertension Other    Stroke Other    Diabetes Mother    Hypertension Mother     Social History Social History   Tobacco Use   Smoking status: Never   Smokeless tobacco: Never  Vaping Use   Vaping Use: Never used  Substance Use Topics   Alcohol use: No   Drug use: Never     Allergies   Patient has no known allergies.   Review of Systems Review of Systems Per HPI  Physical Exam Triage Vital Signs ED Triage Vitals [12/15/21 1317]  Enc Vitals  Group     BP 130/80     Pulse Rate 82     Resp 20     Temp 98.3 F (36.8 C)     Temp Source Oral     SpO2 97 %     Weight      Height      Head Circumference      Peak Flow      Pain Score 7     Pain Loc      Pain Edu?      Excl. in GC?    No data found.  Updated Vital Signs BP 130/80 (BP Location: Right Arm)   Pulse 82   Temp 98.3 F (36.8 C) (Oral)   Resp 20   LMP 11/27/2021 (Approximate)   SpO2 97%   Visual Acuity Right Eye Distance:   Left Eye Distance:   Bilateral Distance:    Right Eye Near:   Left Eye Near:    Bilateral Near:     Physical Exam Vitals and nursing note reviewed.  Constitutional:      Appearance: Normal appearance. She is not ill-appearing.  HENT:     Head: Atraumatic.     Mouth/Throat:     Mouth: Mucous membranes are moist.  Eyes:     Extraocular Movements: Extraocular movements intact.     Conjunctiva/sclera:  Conjunctivae normal.  Cardiovascular:     Rate and Rhythm: Normal rate and regular rhythm.     Heart sounds: Normal heart sounds.  Pulmonary:     Effort: Pulmonary effort is normal.     Breath sounds: Normal breath sounds.  Musculoskeletal:        General: Normal range of motion.     Cervical back: Normal range of motion and neck supple.  Skin:    General: Skin is warm.     Comments: Firm 2 to 3 cm in diameter nonfluctuant mass immediately to the left nipple under the areola tender to palpation.  Small area draining thick purulent drainage just beside the nipple over top of the mass.  Neurological:     Mental Status: She is alert and oriented to person, place, and time.     Motor: No weakness.     Gait: Gait normal.  Psychiatric:        Mood and Affect: Mood normal.        Thought Content: Thought content normal.        Judgment: Judgment normal.      UC Treatments / Results  Labs (all labs ordered are listed, but only abnormal results are displayed) Labs Reviewed - No data to display  EKG   Radiology No results found.  Procedures Incision and Drainage  Date/Time: 12/15/2021 2:10 PM  Performed by: Particia Nearing, PA-C Authorized by: Particia Nearing, PA-C   Consent:    Consent obtained:  Verbal   Consent given by:  Patient   Risks, benefits, and alternatives were discussed: yes     Risks discussed:  Bleeding, incomplete drainage, damage to other organs, infection and pain   Alternatives discussed:  Alternative treatment Universal protocol:    Procedure explained and questions answered to patient or proxy's satisfaction: yes     Relevant documents present and verified: yes     Patient identity confirmed:  Verbally with patient Location:    Type:  Abscess   Size:  2 cm   Location: Left breast. Pre-procedure details:    Skin preparation:  Chlorhexidine with alcohol  Sedation:    Sedation type:  None Anesthesia:    Anesthesia method:  Local  infiltration   Local anesthetic:  Lidocaine 2% w/o epi Procedure type:    Complexity:  Simple Procedure details:    Incision types:  Stab incision   Incision depth:  Dermal   Wound management:  Probed and deloculated   Drainage:  Bloody   Drainage amount:  Scant   Wound treatment:  Wound left open   Packing materials:  None Post-procedure details:    Procedure completion:  Tolerated well, no immediate complications  (including critical care time)  Medications Ordered in UC Medications - No data to display  Initial Impression / Assessment and Plan / UC Course  I have reviewed the triage vital signs and the nursing notes.  Pertinent labs & imaging results that were available during my care of the patient were reviewed by me and considered in my medical decision making (see chart for details).     Spontaneously draining, Firm.  Recommended against I&D, however patient requested attempt so I&D was attempted with very minimal drainage produced.  Will change to Bactrim, continue warm compresses and close PCP follow-up.  Return for worsening symptoms.  Final Clinical Impressions(s) / UC Diagnoses   Final diagnoses:  Breast abscess   Discharge Instructions   None    ED Prescriptions     Medication Sig Dispense Auth. Provider   sulfamethoxazole-trimethoprim (BACTRIM DS) 800-160 MG tablet Take 1 tablet by mouth 2 (two) times daily for 7 days. 14 tablet Particia Nearing, [place]      PDMP not reviewed this encounter.   Particia Nearing, [place] 12/15/21 1411

## 2022-04-25 ENCOUNTER — Ambulatory Visit: Payer: Medicaid Other | Admitting: Family Medicine

## 2022-05-05 ENCOUNTER — Ambulatory Visit: Payer: Medicaid Other | Admitting: Family Medicine

## 2022-05-23 ENCOUNTER — Encounter: Payer: Self-pay | Admitting: Family Medicine

## 2022-05-23 ENCOUNTER — Ambulatory Visit: Payer: Medicaid Other | Admitting: Family Medicine

## 2022-05-23 VITALS — BP 152/86 | HR 67 | Resp 16 | Ht 65.0 in | Wt 256.0 lb

## 2022-05-23 DIAGNOSIS — Z1159 Encounter for screening for other viral diseases: Secondary | ICD-10-CM | POA: Diagnosis not present

## 2022-05-23 DIAGNOSIS — R7301 Impaired fasting glucose: Secondary | ICD-10-CM

## 2022-05-23 DIAGNOSIS — N61 Mastitis without abscess: Secondary | ICD-10-CM

## 2022-05-23 DIAGNOSIS — E559 Vitamin D deficiency, unspecified: Secondary | ICD-10-CM | POA: Diagnosis not present

## 2022-05-23 DIAGNOSIS — E7849 Other hyperlipidemia: Secondary | ICD-10-CM

## 2022-05-23 DIAGNOSIS — I10 Essential (primary) hypertension: Secondary | ICD-10-CM

## 2022-05-23 DIAGNOSIS — S21039A Puncture wound without foreign body of unspecified breast, initial encounter: Secondary | ICD-10-CM

## 2022-05-23 DIAGNOSIS — Z114 Encounter for screening for human immunodeficiency virus [HIV]: Secondary | ICD-10-CM

## 2022-05-23 DIAGNOSIS — E038 Other specified hypothyroidism: Secondary | ICD-10-CM

## 2022-05-23 MED ORDER — DOXYCYCLINE HYCLATE 100 MG PO TABS: 100.0000 mg | ORAL_TABLET | Freq: Two times a day (BID) | ORAL | 0 refills | Status: AC

## 2022-05-23 MED ORDER — OLMESARTAN MEDOXOMIL 20 MG PO TABS: 20.0000 mg | ORAL_TABLET | Freq: Every day | ORAL | 1 refills | Status: DC

## 2022-05-23 NOTE — Progress Notes (Unsigned)
Established Patient Office Visit  Subjective:  Patient ID: Angela Mcclure, female    DOB: 1991-01-06  Age: 31 y.o. MRN: [id number]  CC:  Chief Complaint  Patient presents with   Breast Problem    Has been seeing some pus from her left breast and she is wanting labs done     HPI Angela Mcclure is a 31 y.o. female with past medical history of nipple infection, morbid obesity presents for f/u of  chronic medical conditions. For the details of today's visit, please refer to the assessment and plan.     Past Medical History:  Diagnosis Date   Anxiety    Asthma    History of gestational hypertension 10/18/2013   Postpartum hypertension 05/14/2015    Past Surgical History:  Procedure Laterality Date   NO PAST SURGERIES      Family History  Problem Relation Age of Onset   Diabetes Other    Hypertension Other    Stroke Other    Diabetes Mother    Hypertension Mother     Social History   Socioeconomic History   Marital status: Significant Other    Spouse name: Not on file   Number of children: 3   Years of education: Not on file   Highest education level: 12th grade  Occupational History   Occupation: Aided    Comment: [employer]   Tobacco Use   Smoking status: Never   Smokeless tobacco: Never  Vaping Use   Vaping Use: Never used  Substance and Sexual Activity   Alcohol use: No   Drug use: Never   Sexual activity: Yes    Birth control/protection: None  Other Topics Concern   Not on file  Social History Narrative   Lives with 3 children father of children    4- [name]    5- [name]   6- [name]       Enjoys: shopping, eating out, relaxing, watching movies       Diet: eats all food groups   Caffeine: sweet tea    Water: at times 16 oz       Wears seat belt   Smoke and carbon monoxide detectors    Does not use phone while driving       Social Determinants of Health   Financial Resource Strain: Low Risk  (03/27/2021)   Overall  Financial Resource Strain (CARDIA)    Difficulty of Paying Living Expenses: Not hard at all  Food Insecurity: No Food Insecurity (03/27/2021)   Hunger Vital Sign    Worried About Running Out of Food in the Last Year: Never true    [place] in the Last Year: Never true  Transportation Needs: No Transportation Needs (03/27/2021)   PRAPARE - Hydrologist (Medical): No    Lack of Transportation (Non-Medical): No  Physical Activity: Insufficiently Active (03/27/2021)   Exercise Vital Sign    Days of Exercise per Week: 3 days    Minutes of Exercise per Session: 20 min  Stress: No Stress Concern Present (03/27/2021)   [place]    Feeling of Stress : Not at all  Social Connections: Socially Isolated (03/27/2021)   Social Connection and Isolation Panel [NHANES]    Frequency of Communication with Friends and Family: More than three times a week    Frequency of Social Gatherings with Friends and Family: Three times  a week    Attends Religious Services: Never    Active Member of Clubs or Organizations: No    Attends Archivist Meetings: Patient refused    Marital Status: Never married  Intimate Partner Violence: Not At Risk (03/27/2021)   Humiliation, Afraid, Rape, and Kick questionnaire    Fear of Current or Ex-Partner: No    Emotionally Abused: No    Physically Abused: No    Sexually Abused: No    Outpatient Medications Prior to Visit  Medication Sig Dispense Refill   Cholecalciferol (VITAMIN D3) 25 MCG (1000 UT) CAPS Take 1 capsule (1,000 Units total) by mouth daily. 60 capsule 3   Prenatal Vit-Fe Fumarate-FA (PNV PRENATAL PLUS MULTIVITAMIN) 27-1 MG TABS Take 1 daily 30 tablet 12   amoxicillin-clavulanate (AUGMENTIN) 875-125 MG tablet Take 1 tablet by mouth 2 (two) times daily. 20 tablet 0   No facility-administered medications prior to visit.    No Known  Allergies  ROS Review of Systems  Constitutional:  Negative for chills and fever.  Eyes:  Negative for visual disturbance.  Respiratory:  Negative for chest tightness and shortness of breath.   Skin:  Positive for wound.  Neurological:  Negative for dizziness and headaches.      Objective:    Physical Exam HENT:     Head: Normocephalic.     Mouth/Throat:     Mouth: Mucous membranes are moist.  Cardiovascular:     Rate and Rhythm: Normal rate.     Heart sounds: Normal heart sounds.  Pulmonary:     Effort: Pulmonary effort is normal.     Breath sounds: Normal breath sounds.  Skin:    Findings: Lesion (Erythematous open wound of the left nipple) present.  Neurological:     Mental Status: She is alert.     BP (!) 152/86 (BP Location: Left Arm)   Pulse 67   Resp 16   Ht _0  (1.651 m)   Wt 256 lb (116.1 kg)   SpO2 96%   BMI 42.60 kg/m  Wt Readings from Last 3 Encounters:  05/23/22 256 lb (116.1 kg)  08/19/21 261 lb (118.4 kg)  07/12/21 258 lb 0.6 oz (117 kg)    Lab Results  Component Value Date   TSH 1.250 04/04/2021   Lab Results  Component Value Date   WBC 6.5 04/04/2021   HGB 10.8 (L) 04/04/2021   HCT 34.1 04/04/2021   MCV 71 (L) 04/04/2021   PLT 259 04/04/2021   Lab Results  Component Value Date   NA 140 04/04/2021   K 4.2 04/04/2021   CO2 23 04/04/2021   GLUCOSE 77 04/04/2021   BUN 12 04/04/2021   CREATININE 0.82 04/04/2021   BILITOT <0.2 04/04/2021   ALKPHOS 55 04/04/2021   AST 14 04/04/2021   ALT 15 04/04/2021   PROT 7.4 04/04/2021   ALBUMIN 4.5 04/04/2021   CALCIUM 9.4 04/04/2021   ANIONGAP 10 07/24/2016   EGFR 98 04/04/2021   No results found for: "CHOL" No results found for: "HDL" No results found for: "LDLCALC" No results found for: "TRIG" No results found for: "CHOLHDL" Lab Results  Component Value Date   HGBA1C 5.5 07/18/2019      Assessment & Plan:  Infected pierced nipple Assessment & Plan: She complains of pus from  her left nipple at the site of her nipple piercing incision Topical triple antibiotic ointment ordered We will treat with doxycycline 100 mg twice daily for 5 days  Orders: -  Doxycycline Hyclate; Take 1 tablet (100 mg total) by mouth 2 (two) times daily for 5 days. 1 po bid  Dispense: 10 tablet; Refill: 0  IFG (impaired fasting glucose) -     Hemoglobin A1c  Vitamin D deficiency -     VITAMIN D 25 Hydroxy (Vit-D Deficiency, Fractures)  Need for hepatitis C screening test -     Hepatitis C antibody  Encounter for screening for HIV -     HIV Antibody (routine testing w rflx)  Other specified hypothyroidism -     TSH + free T4  Other hyperlipidemia -     Lipid panel -     CMP14+EGFR -     CBC with Differential/Platelet  Primary hypertension Assessment & Plan: Uncontrolled She reports not taking any medication for her blood pressure We will start the patient on olmesartan 20 mg daily today Encouraged low-sodium diet with increased physical activities Follow-up in 4 weeks Will assess CBC and CMP today BP Readings from Last 3 Encounters:  05/23/22 (!) 152/86  12/15/21 130/80  11/23/21 125/86     Orders: -     Olmesartan Medoxomil; Take 1 tablet (20 mg total) by mouth daily.  Dispense: 30 tablet; Refill: 1    Follow-up: No follow-ups on file.   Alvira Monday, FNP

## 2022-05-23 NOTE — Patient Instructions (Addendum)
I appreciate the opportunity to provide care to you today!    Follow up:  4 weeks for BP  Labs: please stop by the lab during the week to get your blood drawn (CBC, CMP, TSH, Lipid profile, HgA1c, Vit D)  Screening: HIV and Hep C  Please be mindful of your sodium intake I recommend less than 1500 mg of sodium daily I also recommend creasing her physical activities and stress level I will follow-up on your blood pressure in a month  Please pick up your antibiotic at the pharmacy and complete therapy    Please continue to a heart-healthy diet and increase your physical activities. Try to exercise for at least three times a week.      It was a pleasure to see you and I look forward to continuing to work together on your health and well-being. Please do not hesitate to call the office if you need care or have questions about your care.   Have a wonderful day and week. With Gratitude, Gilmore Laroche MSN, FNP-BC

## 2022-05-24 NOTE — Assessment & Plan Note (Signed)
She complains of pus from her left nipple at the site of her nipple piercing incision Topical triple antibiotic ointment ordered We will treat with doxycycline 100 mg twice daily for 5 days

## 2022-05-24 NOTE — Assessment & Plan Note (Signed)
Uncontrolled She reports not taking any medication for her blood pressure We will start the patient on olmesartan 20 mg daily today Encouraged low-sodium diet with increased physical activities Follow-up in 4 weeks Will assess CBC and CMP today BP Readings from Last 3 Encounters:  05/23/22 (!) 152/86  12/15/21 130/80  11/23/21 125/86

## 2022-05-30 LAB — CBC WITH DIFFERENTIAL/PLATELET
Basophils Absolute: 0 10*3/uL (ref 0.0–0.2)
Basos: 0 %
EOS (ABSOLUTE): 0.1 10*3/uL (ref 0.0–0.4)
Eos: 1 %
Hematocrit: 35.2 % (ref 34.0–46.6)
Hemoglobin: 10.8 g/dL — ABNORMAL LOW (ref 11.1–15.9)
Immature Grans (Abs): 0 10*3/uL (ref 0.0–0.1)
Immature Granulocytes: 0 %
Lymphocytes Absolute: 2.6 10*3/uL (ref 0.7–3.1)
Lymphs: 43 %
MCH: 21.5 pg — ABNORMAL LOW (ref 26.6–33.0)
MCHC: 30.7 g/dL — ABNORMAL LOW (ref 31.5–35.7)
MCV: 70 fL — ABNORMAL LOW (ref 79–97)
Monocytes Absolute: 0.3 10*3/uL (ref 0.1–0.9)
Monocytes: 4 %
Neutrophils Absolute: 3.1 10*3/uL (ref 1.4–7.0)
Neutrophils: 52 %
Platelets: 273 10*3/uL (ref 150–450)
RBC: 5.02 x10E6/uL (ref 3.77–5.28)
RDW: 15.3 % (ref 11.7–15.4)
WBC: 6 10*3/uL (ref 3.4–10.8)

## 2022-05-30 LAB — CMP14+EGFR
ALT: 15 IU/L (ref 0–32)
AST: 17 IU/L (ref 0–40)
Albumin/Globulin Ratio: 1.5 (ref 1.2–2.2)
Albumin: 4.5 g/dL (ref 3.9–4.9)
Alkaline Phosphatase: 54 IU/L (ref 44–121)
BUN/Creatinine Ratio: 9 (ref 9–23)
BUN: 8 mg/dL (ref 6–20)
Bilirubin Total: 0.3 mg/dL (ref 0.0–1.2)
CO2: 22 mmol/L (ref 20–29)
Calcium: 9.9 mg/dL (ref 8.7–10.2)
Chloride: 102 mmol/L (ref 96–106)
Creatinine, Ser: 0.85 mg/dL (ref 0.57–1.00)
Globulin, Total: 3 g/dL (ref 1.5–4.5)
Glucose: 80 mg/dL (ref 70–99)
Potassium: 4.1 mmol/L (ref 3.5–5.2)
Sodium: 139 mmol/L (ref 134–144)
Total Protein: 7.5 g/dL (ref 6.0–8.5)
eGFR: 93 mL/min/{1.73_m2} (ref >59)

## 2022-05-30 LAB — VITAMIN D 25 HYDROXY (VIT D DEFICIENCY, FRACTURES): Vit D, 25-Hydroxy: 20.6 ng/mL — ABNORMAL LOW (ref 30.0–100.0)

## 2022-05-30 LAB — TSH+FREE T4
Free T4: 1.3 ng/dL (ref 0.82–1.77)
TSH: 1.3 u[IU]/mL (ref 0.450–4.500)

## 2022-05-30 LAB — LIPID PANEL
Chol/HDL Ratio: 4.6 ratio — ABNORMAL HIGH (ref 0.0–4.4)
Cholesterol, Total: 185 mg/dL (ref 100–199)
HDL: 40 mg/dL (ref >39)
LDL Chol Calc (NIH): 129 mg/dL — ABNORMAL HIGH (ref 0–99)
Triglycerides: 86 mg/dL (ref 0–149)
VLDL Cholesterol Cal: 16 mg/dL (ref 5–40)

## 2022-05-30 LAB — HEPATITIS C ANTIBODY: Hep C Virus Ab: NONREACTIVE

## 2022-05-30 LAB — HIV ANTIBODY (ROUTINE TESTING W REFLEX): HIV Screen 4th Generation wRfx: NONREACTIVE

## 2022-05-30 LAB — HEMOGLOBIN A1C
Est. average glucose Bld gHb Est-mCnc: 117 mg/dL
Hgb A1c MFr Bld: 5.7 % — ABNORMAL HIGH (ref 4.8–5.6)

## 2022-06-04 ENCOUNTER — Other Ambulatory Visit: Payer: Self-pay | Admitting: Family Medicine

## 2022-06-14 ENCOUNTER — Telehealth: Payer: Medicaid Other | Admitting: Physician Assistant

## 2022-06-14 DIAGNOSIS — J069 Acute upper respiratory infection, unspecified: Secondary | ICD-10-CM

## 2022-06-14 DIAGNOSIS — B9689 Other specified bacterial agents as the cause of diseases classified elsewhere: Secondary | ICD-10-CM

## 2022-06-14 MED ORDER — AZITHROMYCIN 250 MG PO TABS: ORAL_TABLET | ORAL | 0 refills | Status: AC

## 2022-06-14 MED ORDER — BENZONATATE 100 MG PO CAPS
100.0000 mg | ORAL_CAPSULE | Freq: Three times a day (TID) | ORAL | 0 refills | Status: DC | PRN
Start: 2022-06-14 — End: 2022-11-20

## 2022-06-14 NOTE — Patient Instructions (Signed)
Angela Mcclure, thank you for joining Margaretann Loveless, PA-C for today's virtual visit.  While this provider is not your primary care provider (PCP), if your PCP is located in our provider database this encounter information will be shared with them immediately following your visit.   A [facility] MyChart account gives you access to today's visit and all your visits, tests, and labs performed at [facility] " click here if you don't have a [facility] MyChart account or go to [email or web address]  Consent: (Patient) Angela Mcclure provided verbal consent for this virtual visit at the beginning of the encounter.  Current Medications:  Current Outpatient Medications:    azithromycin (ZITHROMAX) 250 MG tablet, Take 2 tablets on day 1, then 1 tablet daily on days 2 through 5, Disp: 6 tablet, Rfl: 0   benzonatate (TESSALON) 100 MG capsule, Take 1 capsule (100 mg total) by mouth 3 (three) times daily as needed., Disp: 30 capsule, Rfl: 0   Cholecalciferol (VITAMIN D3) 25 MCG (1000 UT) CAPS, Take 1 capsule (1,000 Units total) by mouth daily., Disp: 60 capsule, Rfl: 3   olmesartan (BENICAR) 20 MG tablet, Take 1 tablet (20 mg total) by mouth daily., Disp: 30 tablet, Rfl: 1   Prenatal Vit-Fe Fumarate-FA (PNV PRENATAL PLUS MULTIVITAMIN) 27-1 MG TABS, Take 1 daily, Disp: 30 tablet, Rfl: 12   Medications ordered in this encounter:  Meds ordered this encounter  Medications   azithromycin (ZITHROMAX) 250 MG tablet    Sig: Take 2 tablets on day 1, then 1 tablet daily on days 2 through 5    Dispense:  6 tablet    Refill:  0    Order Specific Question:   Supervising Provider    Answer:   Merrilee Jansky [[id number]]   benzonatate (TESSALON) 100 MG capsule    Sig: Take 1 capsule (100 mg total) by mouth 3 (three) times daily as needed.    Dispense:  30 capsule    Refill:  0    Order Specific Question:   Supervising Provider    Answer:   Merrilee Jansky [id number]     *If you need  refills on other medications prior to your next appointment, please contact your pharmacy*  Follow-Up: Call back or seek an in-person evaluation if the symptoms worsen or if the condition fails to improve as anticipated.  [facility] [phone]  Other Instructions Upper Respiratory Infection, Adult An upper respiratory infection (URI) is a common viral infection of the nose, throat, and upper air passages that lead to the lungs. The most common type of URI is the common cold. URIs usually get better on their own, without medical treatment. What are the causes? A URI is caused by a virus. You may catch a virus by: Breathing in droplets from an infected person's cough or sneeze. Touching something that has been exposed to the virus (is contaminated) and then touching your mouth, nose, or eyes. What increases the risk? You are more likely to get a URI if: You are very young or very old. You have close contact with others, such as at work, school, or a health care facility. You smoke. You have long-term (chronic) heart or lung disease. You have a weakened disease-fighting system (immune system). You have nasal allergies or asthma. You are experiencing a lot of stress. You have poor nutrition. What are the signs or symptoms? A URI usually involves some of the following symptoms: Runny or stuffy (congested) nose.  Cough. Sneezing. Sore throat. Headache. Fatigue. Fever. Loss of appetite. Pain in your forehead, behind your eyes, and over your cheekbones (sinus pain). Muscle aches. Redness or irritation of the eyes. Pressure in the ears or face. How is this diagnosed? This condition may be diagnosed based on your medical history and symptoms, and a physical exam. Your health care provider may use a swab to take a mucus sample from your nose (nasal swab). This sample can be tested to determine what virus is causing the illness. How is this treated? URIs usually get  better on their own within 7-10 days. Medicines cannot cure URIs, but your health care provider may recommend certain medicines to help relieve symptoms, such as: Over-the-counter cold medicines. Cough suppressants. Coughing is a type of defense against infection that helps to clear the respiratory system, so take these medicines only as recommended by your health care provider. Fever-reducing medicines. Follow these instructions at home: Activity Rest as needed. If you have a fever, stay home from work or school until your fever is gone or until your health care provider says your URI cannot spread to other people (is no longer contagious). Your health care provider may have you wear a face mask to prevent your infection from spreading. Relieving symptoms Gargle with a mixture of salt and water 3-4 times a day or as needed. To make salt water, completely dissolve -1 tsp (3-6 g) of salt in 1 cup (237 mL) of warm water. Use a cool-mist humidifier to add moisture to the air. This can help you breathe more easily. Eating and drinking  Drink enough fluid to keep your urine pale yellow. Eat soups and other clear broths. General instructions  Take over-the-counter and prescription medicines only as told by your health care provider. These include cold medicines, fever reducers, and cough suppressants. Do not use any products that contain nicotine or tobacco. These products include cigarettes, chewing tobacco, and vaping devices, such as e-cigarettes. If you need help quitting, ask your health care provider. Stay away from secondhand smoke. Stay up to date on all immunizations, including the yearly (annual) flu vaccine. Keep all follow-up visits. This is important. How to prevent the spread of infection to others URIs can be contagious. To prevent the infection from spreading: Wash your hands with soap and water for at least 20 seconds. If soap and water are not available, use hand  sanitizer. Avoid touching your mouth, face, eyes, or nose. Cough or sneeze into a tissue or your sleeve or elbow instead of into your hand or into the air.  Contact a health care provider if: You are getting worse instead of better. You have a fever or chills. Your mucus is brown or red. You have yellow or brown discharge coming from your nose. You have pain in your face, especially when you bend forward. You have swollen neck glands. You have pain while swallowing. You have white areas in the back of your throat. Get help right away if: You have shortness of breath that gets worse. You have severe or persistent: Headache. Ear pain. Sinus pain. Chest pain. You have chronic lung disease along with any of the following: Making high-pitched whistling sounds when you breathe, most often when you breathe out (wheezing). Prolonged cough (more than 14 days). Coughing up blood. A change in your usual mucus. You have a stiff neck. You have changes in your: Vision. Hearing. Thinking. Mood. These symptoms may be an emergency. Get help right away. Call 911. Do  not wait to see if the symptoms will go away. Do not drive yourself to the hospital. Summary An upper respiratory infection (URI) is a common infection of the nose, throat, and upper air passages that lead to the lungs. A URI is caused by a virus. URIs usually get better on their own within 7-10 days. Medicines cannot cure URIs, but your health care provider may recommend certain medicines to help relieve symptoms. This information is not intended to replace advice given to you by your health care provider. Make sure you discuss any questions you have with your health care provider. Document Revised: 01/02/2021 Document Reviewed: 01/02/2021 Elsevier Patient Education  2023 Elsevier Inc.    If you have been instructed to have an in-person evaluation today at a local Urgent Care facility, please use the link below. It will take  you to a list of all of our available [facility], including address, phone number and hours of operation. Please do not delay care.  [facility]  If you or a family member do not have a primary care provider, use the link below to schedule a visit and establish care. When you choose a [facility] primary care physician or advanced practice provider, you gain a long-term partner in health. Find a Primary Care Provider  Learn more about [facility]'s in-office and virtual care options:  - Get Care Now

## 2022-06-14 NOTE — Progress Notes (Signed)
Virtual Visit Consent   Angela Mcclure, you are scheduled for a virtual visit with a [facility] provider today. Just as with appointments in the office, your consent must be obtained to participate. Your consent will be active for this visit and any virtual visit you may have with one of our providers in the next 365 days. If you have a MyChart account, a copy of this consent can be sent to you electronically.  As this is a virtual visit, video technology does not allow for your provider to perform a traditional examination. This may limit your provider's ability to fully assess your condition. If your provider identifies any concerns that need to be evaluated in person or the need to arrange testing (such as labs, EKG, etc.), we will make arrangements to do so. Although advances in technology are sophisticated, we cannot ensure that it will always work on either your end or our end. If the connection with a video visit is poor, the visit may have to be switched to a telephone visit. With either a video or telephone visit, we are not always able to ensure that we have a secure connection.  By engaging in this virtual visit, you consent to the provision of healthcare and authorize for your insurance to be billed (if applicable) for the services provided during this visit. Depending on your insurance coverage, you may receive a charge related to this service.  I need to obtain your verbal consent now. Are you willing to proceed with your visit today? Angela Mcclure has provided verbal consent on 06/14/2022 for a virtual visit (video or telephone). Mar Daring, PA-C  Date: 06/14/2022 9:06 AM  Virtual Visit via Video Note   I, Mar Daring, connected with  Angela Mcclure  ([id number], 1991/02/25) on 06/14/22 at  9:00 AM EST by a video-enabled telemedicine application and verified that I am speaking with the correct person using two identifiers.  Location: Patient: Virtual Visit Location  Patient: Home Provider: Virtual Visit Location Provider: Home Office   I discussed the limitations of evaluation and management by telemedicine and the availability of in person appointments. The patient expressed understanding and agreed to proceed.    History of Present Illness: Angela Mcclure is a 31 y.o. who identifies as a female who was assigned female at birth, and is being seen today for cough and sore throat.  HPI: Sore Throat  This is a new problem. The current episode started 1 to 4 weeks ago (06/08/22). The problem has been gradually worsening. There has been no fever. The pain is moderate. Associated symptoms include congestion, coughing (dry), headaches (initially, but now improved), a hoarse voice and trouble swallowing. Pertinent negatives include no drooling, ear discharge, ear pain, plugged ear sensation or swollen glands. Associated symptoms comments: Intermittent chills and hot flashes short lived. She has had no exposure to strep or mono. Treatments tried: mucinex, chloraseptic spray, cough drops. The treatment provided no relief.     Problems:  Patient Active Problem List   Diagnosis Date Noted   Menorrhagia with regular cycle 08/19/2021   Mass of lower outer quadrant of left breast 08/19/2021   Infected pierced nipple 07/12/2021   Mass of nipple 07/12/2021   Cellulitis 04/14/2021   Anxiety 04/17/2020   Nipple infection in female 01/18/2020   Iron deficiency anemia 08/24/2019   Fatigue 07/12/2019   Family history of diabetes mellitus in mother 07/12/2019   Vitamin D deficiency 07/12/2019   Hypertension 07/12/2019  Morbid obesity (HCC) 10/18/2013   Asthma 08/18/2012    Allergies: No Known Allergies Medications:  Current Outpatient Medications:    azithromycin (ZITHROMAX) 250 MG tablet, Take 2 tablets on day 1, then 1 tablet daily on days 2 through 5, Disp: 6 tablet, Rfl: 0   benzonatate (TESSALON) 100 MG capsule, Take 1 capsule (100 mg total) by mouth 3 (three)  times daily as needed., Disp: 30 capsule, Rfl: 0   Cholecalciferol (VITAMIN D3) 25 MCG (1000 UT) CAPS, Take 1 capsule (1,000 Units total) by mouth daily., Disp: 60 capsule, Rfl: 3   olmesartan (BENICAR) 20 MG tablet, Take 1 tablet (20 mg total) by mouth daily., Disp: 30 tablet, Rfl: 1   Prenatal Vit-Fe Fumarate-FA (PNV PRENATAL PLUS MULTIVITAMIN) 27-1 MG TABS, Take 1 daily, Disp: 30 tablet, Rfl: 12  Observations/Objective: Patient is well-developed, well-nourished in no acute distress.  Resting comfortably at home.  Head is normocephalic, atraumatic.  No labored breathing.  Speech is clear and coherent with logical content.  Patient is alert and oriented at baseline.    Assessment and Plan: 1. Bacterial upper respiratory infection - azithromycin (ZITHROMAX) 250 MG tablet; Take 2 tablets on day 1, then 1 tablet daily on days 2 through 5  Dispense: 6 tablet; Refill: 0 - benzonatate (TESSALON) 100 MG capsule; Take 1 capsule (100 mg total) by mouth 3 (three) times daily as needed.  Dispense: 30 capsule; Refill: 0  - Worsening over a week despite OTC medications - Will treat with Z-pack and tessalon perles - Can continue Mucinex  - Push fluids.  - Rest.  - Steam and humidifier can help - Seek in person evaluation if worsening or symptoms fail to improve    Follow Up Instructions: I discussed the assessment and treatment plan with the patient. The patient was provided an opportunity to ask questions and all were answered. The patient agreed with the plan and demonstrated an understanding of the instructions.  A copy of instructions were sent to the patient via MyChart unless otherwise noted below.    The patient was advised to call back or seek an in-person evaluation if the symptoms worsen or if the condition fails to improve as anticipated.  Time:  I spent 8 minutes with the patient via telehealth technology discussing the above problems/concerns.    Margaretann Loveless, PA-C

## 2022-06-23 ENCOUNTER — Ambulatory Visit: Payer: Medicaid Other | Admitting: Family Medicine

## 2022-07-11 ENCOUNTER — Telehealth: Payer: Medicaid Other | Admitting: Physician Assistant

## 2022-07-11 DIAGNOSIS — R3989 Other symptoms and signs involving the genitourinary system: Secondary | ICD-10-CM

## 2022-07-11 MED ORDER — NITROFURANTOIN MONOHYD MACRO 100 MG PO CAPS: 100.0000 mg | ORAL_CAPSULE | Freq: Two times a day (BID) | ORAL | 0 refills | Status: DC

## 2022-07-11 NOTE — Patient Instructions (Signed)
Angela Mcclure, thank you for joining Mar Daring, PA-C for today's virtual visit.  While this provider is not your primary care provider (PCP), if your PCP is located in our provider database this encounter information will be shared with them immediately following your visit.   [facility] account gives you access to today's visit and all your visits, tests, and labs performed at [facility] " click here if you don't have a [facility] account or go to [email or web address]  Consent: (Patient) Angela Mcclure provided verbal consent for this virtual visit at the beginning of the encounter.  Current Medications:  Current Outpatient Medications:    nitrofurantoin, macrocrystal-monohydrate, (MACROBID) 100 MG capsule, Take 1 capsule (100 mg total) by mouth 2 (two) times daily., Disp: 10 capsule, Rfl: 0   benzonatate (TESSALON) 100 MG capsule, Take 1 capsule (100 mg total) by mouth 3 (three) times daily as needed., Disp: 30 capsule, Rfl: 0   Cholecalciferol (VITAMIN D3) 25 MCG (1000 UT) CAPS, Take 1 capsule (1,000 Units total) by mouth daily., Disp: 60 capsule, Rfl: 3   olmesartan (BENICAR) 20 MG tablet, Take 1 tablet (20 mg total) by mouth daily., Disp: 30 tablet, Rfl: 1   Prenatal Vit-Fe Fumarate-FA (PNV PRENATAL PLUS MULTIVITAMIN) 27-1 MG TABS, Take 1 daily, Disp: 30 tablet, Rfl: 12   Medications ordered in this encounter:  Meds ordered this encounter  Medications   nitrofurantoin, macrocrystal-monohydrate, (MACROBID) 100 MG capsule    Sig: Take 1 capsule (100 mg total) by mouth 2 (two) times daily.    Dispense:  10 capsule    Refill:  0    Order Specific Question:   Supervising Provider    Answer:   Chase Picket [id number]     *If you need refills on other medications prior to your next appointment, please contact your pharmacy*  Follow-Up: Call back or seek an in-person evaluation if the symptoms worsen or if the condition fails to  improve as anticipated.  [facility] [phone]  Other Instructions  Urinary Tract Infection, Adult A urinary tract infection (UTI) is an infection of any part of the urinary tract. The urinary tract includes: The kidneys. The ureters. The bladder. The urethra. These organs make, store, and get rid of pee (urine) in the body. What are the causes? This infection is caused by germs (bacteria) in your genital area. These germs grow and cause swelling (inflammation) of your urinary tract. What increases the risk? The following factors may make you more likely to develop this condition: Using a small, thin tube (catheter) to drain pee. Not being able to control when you pee or poop (incontinence). Being female. If you are female, these things can increase the risk: Using these methods to prevent pregnancy: A medicine that kills sperm (spermicide). A device that blocks sperm (diaphragm). Having low levels of a female hormone (estrogen). Being pregnant. You are more likely to develop this condition if: You have genes that add to your risk. You are sexually active. You take antibiotic medicines. You have trouble peeing because of: A prostate that is bigger than normal, if you are female. A blockage in the part of your body that drains pee from the bladder. A kidney stone. A nerve condition that affects your bladder. Not getting enough to drink. Not peeing often enough. You have other conditions, such as: Diabetes. A weak disease-fighting system (immune system). Sickle cell disease. Gout. Injury of the spine. What are the  signs or symptoms? Symptoms of this condition include: Needing to pee right away. Peeing small amounts often. Pain or burning when peeing. Blood in the pee. Pee that smells bad or not like normal. Trouble peeing. Pee that is cloudy. Fluid coming from the vagina, if you are female. Pain in the belly or lower back. Other symptoms  include: Vomiting. Not feeling hungry. Feeling mixed up (confused). This may be the first symptom in older adults. Being tired and grouchy (irritable). A fever. Watery poop (diarrhea). How is this treated? Taking antibiotic medicine. Taking other medicines. Drinking enough water. In some cases, you may need to see a specialist. Follow these instructions at home:  Medicines Take over-the-counter and prescription medicines only as told by your doctor. If you were prescribed an antibiotic medicine, take it as told by your doctor. Do not stop taking it even if you start to feel better. General instructions Make sure you: Pee until your bladder is empty. Do not hold pee for a long time. Empty your bladder after sex. Wipe from front to back after peeing or pooping if you are a female. Use each tissue one time when you wipe. Drink enough fluid to keep your pee pale yellow. Keep all follow-up visits. Contact a doctor if: You do not get better after 1-2 days. Your symptoms go away and then come back. Get help right away if: You have very bad back pain. You have very bad pain in your lower belly. You have a fever. You have chills. You feeling like you will vomit or you vomit. Summary A urinary tract infection (UTI) is an infection of any part of the urinary tract. This condition is caused by germs in your genital area. There are many risk factors for a UTI. Treatment includes antibiotic medicines. Drink enough fluid to keep your pee pale yellow. This information is not intended to replace advice given to you by your health care provider. Make sure you discuss any questions you have with your health care provider. Document Revised: 01/13/2020 Document Reviewed: 01/13/2020 Elsevier Patient Education  [place].    If you have been instructed to have an in-person evaluation today at a local Urgent Care facility, please use the link below. It will take you to a list of all of  our available [facility], including address, phone number and hours of operation. Please do not delay care.  [facility]  If you or a family member do not have a primary care provider, use the link below to schedule a visit and establish care. When you choose a [facility] primary care physician or advanced practice provider, you gain a long-term partner in health. Find a Primary Care Provider  Learn more about [facility] in-office and virtual care options: [facility]

## 2022-07-11 NOTE — Progress Notes (Signed)
Virtual Visit Consent   Angela Mcclure, you are scheduled for a virtual visit with a [facility] provider today. Just as with appointments in the office, your consent must be obtained to participate. Your consent will be active for this visit and any virtual visit you may have with one of our providers in the next 365 days. If you have a MyChart account, a copy of this consent can be sent to you electronically.  As this is a virtual visit, video technology does not allow for your provider to perform a traditional examination. This may limit your provider's ability to fully assess your condition. If your provider identifies any concerns that need to be evaluated in person or the need to arrange testing (such as labs, EKG, etc.), we will make arrangements to do so. Although advances in technology are sophisticated, we cannot ensure that it will always work on either your end or our end. If the connection with a video visit is poor, the visit may have to be switched to a telephone visit. With either a video or telephone visit, we are not always able to ensure that we have a secure connection.  By engaging in this virtual visit, you consent to the provision of healthcare and authorize for your insurance to be billed (if applicable) for the services provided during this visit. Depending on your insurance coverage, you may receive a charge related to this service.  I need to obtain your verbal consent now. Are you willing to proceed with your visit today? Angela Mcclure has provided verbal consent on 07/11/2022 for a virtual visit (video or telephone). Mar Daring, PA-C  Date: 07/11/2022 4:53 PM  Virtual Visit via Video Note   I, Mar Daring, connected with  Angela Mcclure  ([id number], July 30, 1990) on 07/11/22 at  5:00 PM EST by a video-enabled telemedicine application and verified that I am speaking with the correct person using two identifiers.  Location: Patient: Virtual Visit Location  Patient: Home Provider: Virtual Visit Location Provider: Home Office   I discussed the limitations of evaluation and management by telemedicine and the availability of in person appointments. The patient expressed understanding and agreed to proceed.    History of Present Illness: Angela Mcclure is a 32 y.o. who identifies as a female who was assigned female at birth, and is being seen today for dysuria.  HPI: Dysuria  This is a new problem. The current episode started yesterday. The problem occurs every urination. The problem has been unchanged. The quality of the pain is described as aching and burning. There has been no fever. Associated symptoms include frequency. Pertinent negatives include no chills, flank pain, hematuria, hesitancy, nausea, urgency or vomiting. She has tried nothing for the symptoms. The treatment provided no relief.     Problems:  Patient Active Problem List   Diagnosis Date Noted   Menorrhagia with regular cycle 08/19/2021   Mass of lower outer quadrant of left breast 08/19/2021   Infected pierced nipple 07/12/2021   Mass of nipple 07/12/2021   Cellulitis 04/14/2021   Anxiety 04/17/2020   Nipple infection in female 01/18/2020   Iron deficiency anemia 08/24/2019   Fatigue 07/12/2019   Family history of diabetes mellitus in mother 07/12/2019   Vitamin D deficiency 07/12/2019   Hypertension 07/12/2019   Morbid obesity ([place]) 10/18/2013   Asthma 08/18/2012    Allergies: No Known Allergies Medications:  Current Outpatient Medications:    nitrofurantoin, macrocrystal-monohydrate, (MACROBID) 100 MG capsule, Take  1 capsule (100 mg total) by mouth 2 (two) times daily., Disp: 10 capsule, Rfl: 0   benzonatate (TESSALON) 100 MG capsule, Take 1 capsule (100 mg total) by mouth 3 (three) times daily as needed., Disp: 30 capsule, Rfl: 0   Cholecalciferol (VITAMIN D3) 25 MCG (1000 UT) CAPS, Take 1 capsule (1,000 Units total) by mouth daily., Disp: 60 capsule, Rfl: 3    olmesartan (BENICAR) 20 MG tablet, Take 1 tablet (20 mg total) by mouth daily., Disp: 30 tablet, Rfl: 1   Prenatal Vit-Fe Fumarate-FA (PNV PRENATAL PLUS MULTIVITAMIN) 27-1 MG TABS, Take 1 daily, Disp: 30 tablet, Rfl: 12  Observations/Objective: Patient is well-developed, well-nourished in no acute distress.  Resting comfortably at home.  Head is normocephalic, atraumatic.  No labored breathing. Speech is clear and coherent with logical content.  Patient is alert and oriented at baseline.    Assessment and Plan: 1. Suspected UTI - nitrofurantoin, macrocrystal-monohydrate, (MACROBID) 100 MG capsule; Take 1 capsule (100 mg total) by mouth 2 (two) times daily.  Dispense: 10 capsule; Refill: 0  - Worsening symptoms.  - Will treat empirically with Macrobid - May use AZO for bladder spasms - Continue to push fluids.  - Seek in person evaluation for urine culture if symptoms do not improve or if they worsen.    Follow Up Instructions: I discussed the assessment and treatment plan with the patient. The patient was provided an opportunity to ask questions and all were answered. The patient agreed with the plan and demonstrated an understanding of the instructions.  A copy of instructions were sent to the patient via MyChart unless otherwise noted below.    The patient was advised to call back or seek an in-person evaluation if the symptoms worsen or if the condition fails to improve as anticipated.  Time:  I spent 8 minutes with the patient via telehealth technology discussing the above problems/concerns.    Mar Daring, PA-C

## 2022-07-28 ENCOUNTER — Ambulatory Visit: Payer: Medicaid Other | Admitting: Family Medicine

## 2022-08-12 ENCOUNTER — Ambulatory Visit: Payer: Medicaid Other | Admitting: Family Medicine

## 2022-08-28 IMAGING — MG DIGITAL DIAGNOSTIC BILAT W/ TOMO W/ CAD
6 of 12 series · 6 of 36 positions shown · non-contrast
Comparison: None.

CLINICAL DATA: 30-year-old female with a tender palpable area of
concern in the periareolar left breast. The patient states she has
history of bilateral nipple piercings which occasionally become
infected. She did experience symptoms of infection involving the
left nipple piercing approximally 1 month prior with purulent
drainage from the piercing. She did remove the piercing and receive
a course of antibiotics with improvement in her symptoms of
infection although reports a persistent palpable abnormality in this
location.

EXAM:
DIGITAL DIAGNOSTIC BILATERAL MAMMOGRAM WITH TOMOSYNTHESIS AND CAD;
ULTRASOUND LEFT BREAST LIMITED
TECHNIQUE: Bilateral digital diagnostic mammography and breast tomosynthesis
was performed. The images were evaluated with computer-aided
detection.; Targeted ultrasound examination of the left breast was
performed.

[L CC synth-2D (1 of 2)]
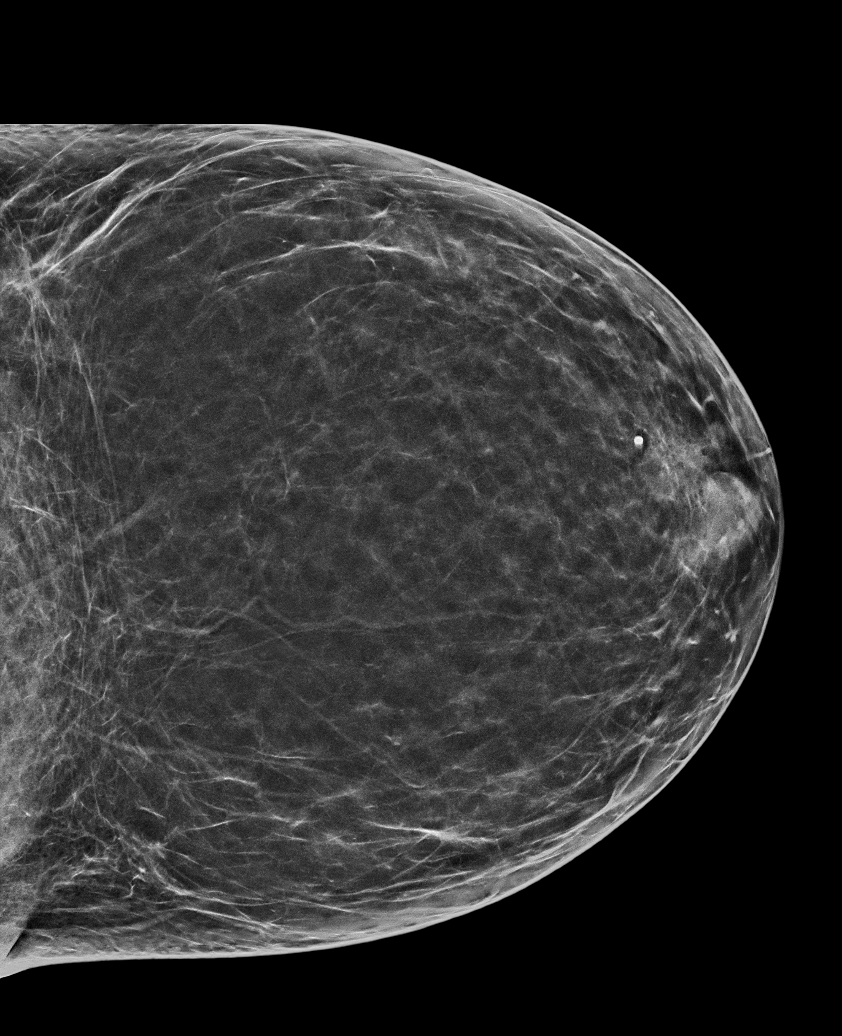

[L CC synth-2D (2 of 2)]
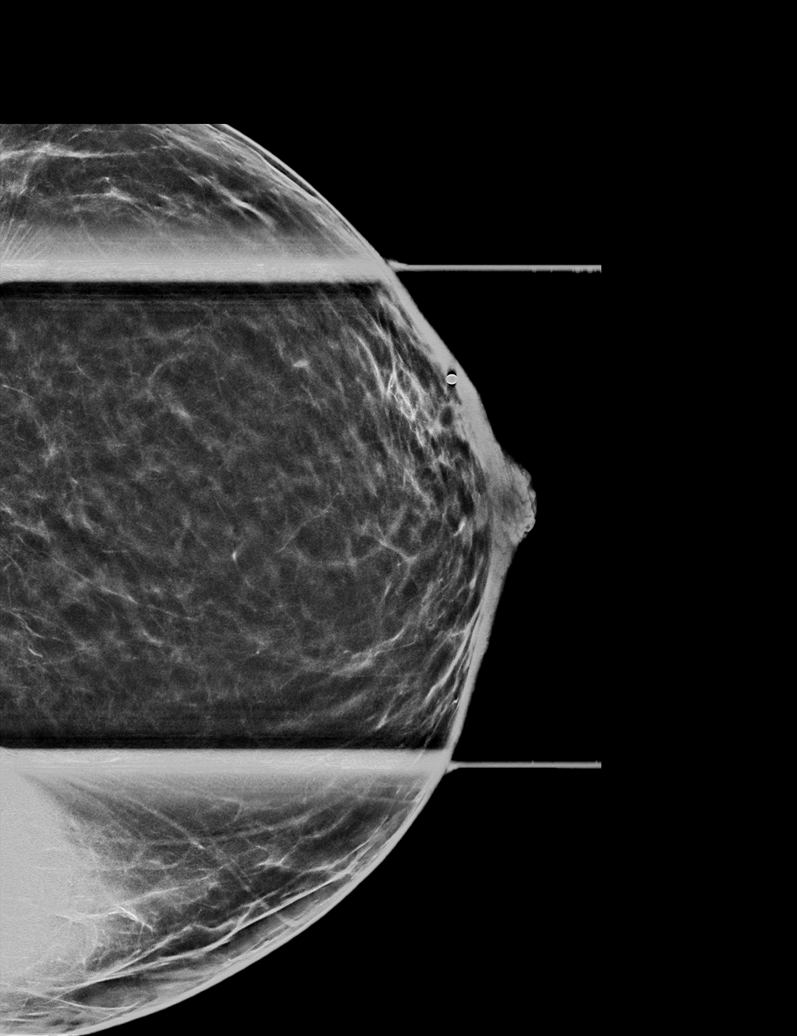

[L MLO synth-2D (1 of 2)]
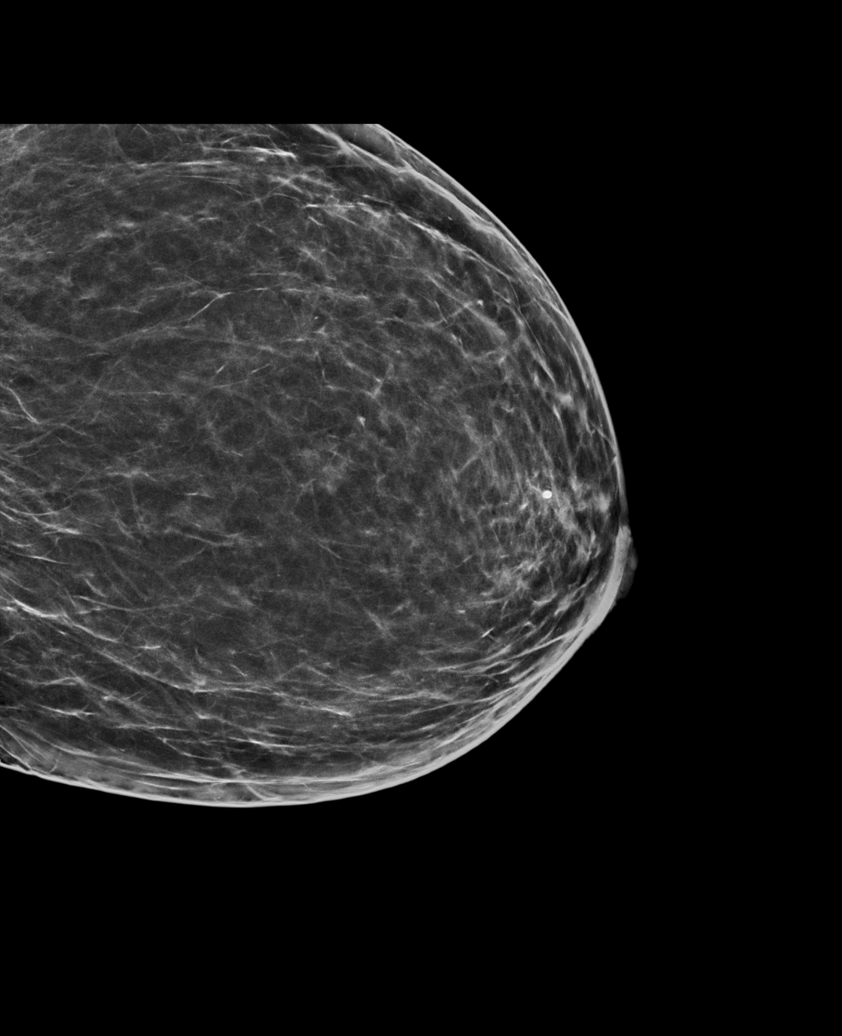

[R MLO synth-2D]
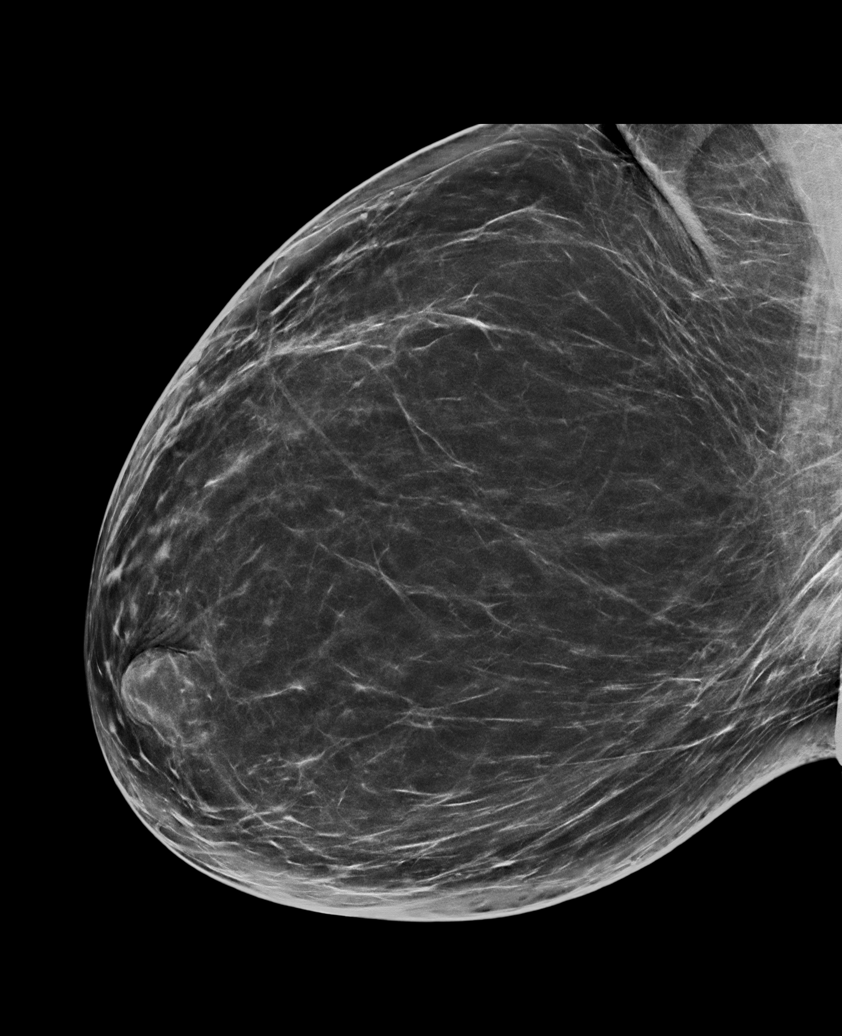

[R CC synth-2D]
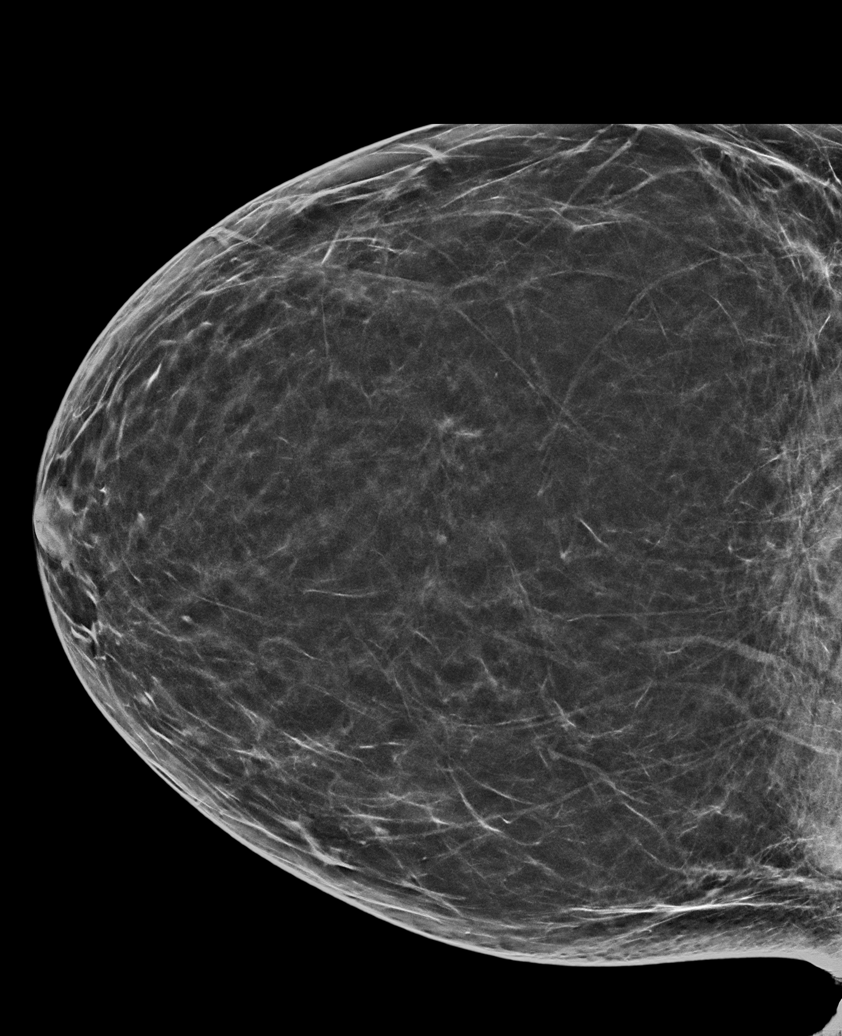

[L MLO synth-2D (2 of 2)]
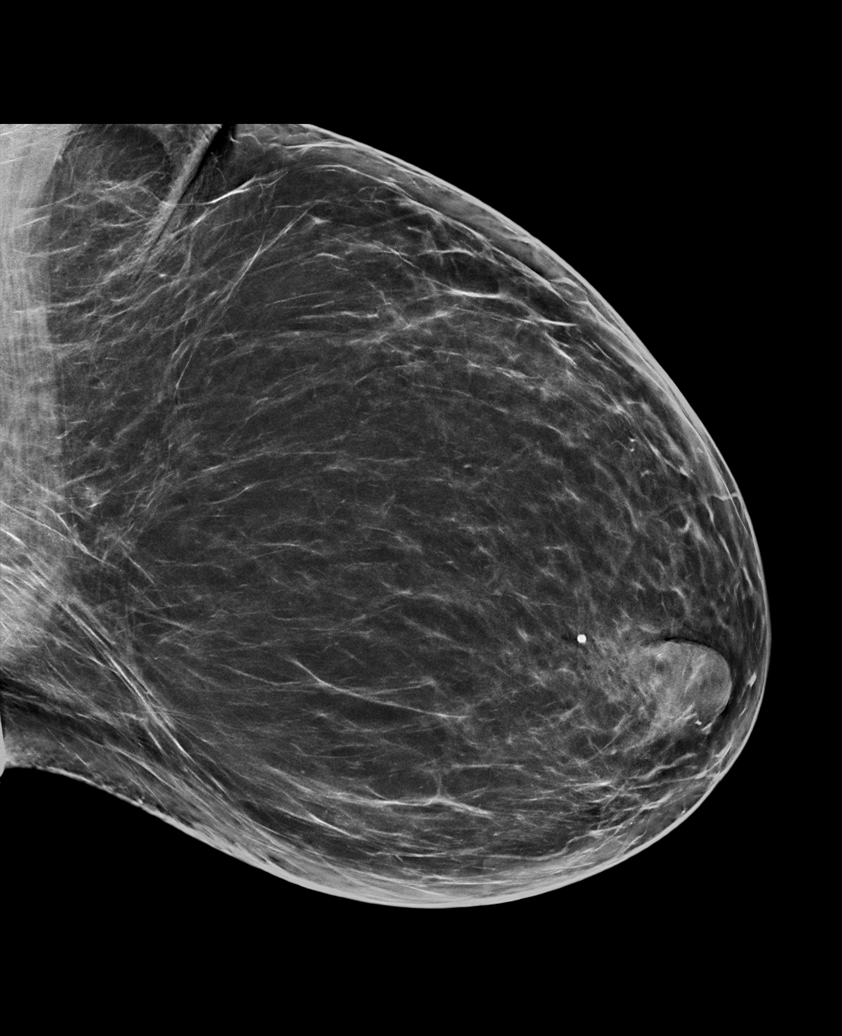

[6 of 36 positions shown; findings below may reference images not displayed]

ACR Breast Density Category b: There are scattered areas of
fibroglandular density.
FINDINGS: There is a focal asymmetry corresponding to the palpable area of
concern in the lateral periareolar left breast which spreads out on
the spot compression CC tomograms demonstrating interspersed fat
suggestive an area of fat necrosis. No suspicious masses or
calcifications seen in the right breast.

Physical examination reveals a firm masslike area involving the
lower outer periareolar left breast.

Targeted ultrasound of the left breast was performed demonstrating a
mixed echogenicity mass at 5 o'clock retroareolar which appears
hyperechoic peripherally and hypoechoic centrally. This measures
approximally 2.1 x 1 x 1.9 cm and may represent a resolving area of
infection or even fat necrosis.
IMPRESSION: Probably benign mass in the left breast at 5 o'clock retroareolar
which may be related to patient's recent left breast infection in
region of nipple piercing.

RECOMMENDATION:
Recommend follow-up left breast ultrasound in 4-6 weeks to
demonstrate resolution/improvement in the probably benign mass in
the periareolar left breast likely related to resolving infection or
even fat necrosis. If there is no change in the palpable abnormality
in appearance of the mass in the left breast, then ultrasound-guided
core biopsy would be recommended. The patient demonstrates
understanding and agrees with this plan.

I have discussed the findings and recommendations with the patient.
If applicable, a reminder letter will be sent to the patient
regarding the next appointment.

BI-RADS CATEGORY  3: Probably benign.

## 2022-09-08 ENCOUNTER — Ambulatory Visit: Payer: Medicaid Other | Admitting: Family Medicine

## 2022-09-22 ENCOUNTER — Ambulatory Visit: Payer: Medicaid Other | Admitting: Family Medicine

## 2022-09-29 ENCOUNTER — Encounter: Payer: Self-pay | Admitting: Family Medicine

## 2022-11-13 ENCOUNTER — Ambulatory Visit: Payer: Medicaid Other | Admitting: Family Medicine

## 2022-11-20 ENCOUNTER — Ambulatory Visit: Payer: Medicaid Other | Admitting: Family Medicine

## 2022-11-20 ENCOUNTER — Encounter: Payer: Self-pay | Admitting: Family Medicine

## 2022-11-20 VITALS — BP 146/88 | HR 83 | Ht 65.0 in | Wt 251.1 lb

## 2022-11-20 DIAGNOSIS — I10 Essential (primary) hypertension: Secondary | ICD-10-CM

## 2022-11-20 DIAGNOSIS — R7301 Impaired fasting glucose: Secondary | ICD-10-CM | POA: Diagnosis not present

## 2022-11-20 DIAGNOSIS — E559 Vitamin D deficiency, unspecified: Secondary | ICD-10-CM | POA: Diagnosis not present

## 2022-11-20 DIAGNOSIS — E7849 Other hyperlipidemia: Secondary | ICD-10-CM

## 2022-11-20 DIAGNOSIS — E0789 Other specified disorders of thyroid: Secondary | ICD-10-CM

## 2022-11-20 NOTE — Assessment & Plan Note (Addendum)
Uncontrolled She reports taking  BP medications  and stopped therapy about a week ago Reports feeling like she had her blood pressure under controlled Encouraged to resume therapy on olmesartan 20 mg daily Low-sodium diet with increased physical activity encouraged Encouraged checking ambulatory readings daily and bringing her readings with her to her next appointment She endorses a mild headache rated 4 out of 10 Denies symptoms of blurry vision, chest pain, palpitation, shortness of breath Encouraged to take over-the-counter analgesic for relief of her headache BP Readings from Last 3 Encounters:  11/20/22 (!) 146/88  05/23/22 (!) 152/86  12/15/21 130/80

## 2022-11-20 NOTE — Patient Instructions (Addendum)
I appreciate the opportunity to provide care to you today!    Follow up:  1 month for BP  Labs: please stop by the lab today to get your blood drawn (CBC, CMP, TSH, Lipid profile, HgA1c, Vit D)  I recommend resuming therapy on olmesartan 20 mg daily I also recommend low-sodium diet with increased physical activity   Please continue to a heart-healthy diet and increase your physical activities. Try to exercise for at least five days a week.      It was a pleasure to see you and I look forward to continuing to work together on your health and well-being. Please do not hesitate to call the office if you need care or have questions about your care.   Have a wonderful day and week. With Gratitude, Gilmore Laroche MSN, FNP-BC

## 2022-11-20 NOTE — Progress Notes (Signed)
Established Patient Office Visit  Subjective:  Patient ID: Angela Mcclure, female    DOB: 06/24/1990  Age: 32 y.o. MRN: [id number]  CC:  Chief Complaint  Patient presents with   Chronic Care Management    Follow up, reports not taking bp medication any more. Has noticed headaches.,     HPI Angela Mcclure is a 32 y.o. female with past medical history of hypertension, and morbid obesity presents for f/u of  chronic medical conditions. For the details of today's visit, please refer to the assessment and plan.     Past Medical History:  Diagnosis Date   Anxiety    Asthma    History of gestational hypertension 10/18/2013   Postpartum hypertension 05/14/2015    Past Surgical History:  Procedure Laterality Date   NO PAST SURGERIES      Family History  Problem Relation Age of Onset   Diabetes Other    Hypertension Other    Stroke Other    Diabetes Mother    Hypertension Mother     Social History   Socioeconomic History   Marital status: Significant Other    Spouse name: Not on file   Number of children: 3   Years of education: Not on file   Highest education level: 12th grade  Occupational History   Occupation: Aided    Comment: [employer]   Tobacco Use   Smoking status: Never   Smokeless tobacco: Never  Vaping Use   Vaping Use: Never used  Substance and Sexual Activity   Alcohol use: No   Drug use: Never   Sexual activity: Yes    Birth control/protection: None  Other Topics Concern   Not on file  Social History Narrative   Lives with 3 children father of children    4- [name]    5- [name]   6- [name]       Enjoys: shopping, eating out, relaxing, watching movies       Diet: eats all food groups   Caffeine: sweet tea    Water: at times 16 oz       Wears seat belt   Smoke and carbon monoxide detectors    Does not use phone while driving       Social Determinants of Health   Financial Resource Strain: Low Risk  (03/27/2021)    Overall Financial Resource Strain (CARDIA)    Difficulty of Paying Living Expenses: Not hard at all  Food Insecurity: No Food Insecurity (03/27/2021)   Hunger Vital Sign    Worried About Running Out of Food in the Last Year: Never true    Ran Out of Food in the Last Year: Never true  Transportation Needs: No Transportation Needs (03/27/2021)   PRAPARE - Administrator, Civil Service (Medical): No    Lack of Transportation (Non-Medical): No  Physical Activity: Insufficiently Active (03/27/2021)   Exercise Vital Sign    Days of Exercise per Week: 3 days    Minutes of Exercise per Session: 20 min  Stress: No Stress Concern Present (03/27/2021)   [employer] of Occupational Health - Occupational Stress Questionnaire    Feeling of Stress : Not at all  Social Connections: Socially Isolated (03/27/2021)   Social Connection and Isolation Panel [NHANES]    Frequency of Communication with Friends and Family: More than three times a week    Frequency of Social Gatherings with Friends and Family: Three times a week  Attends Religious Services: Never    Active Member of Clubs or Organizations: No    Attends Banker Meetings: Patient declined    Marital Status: Never married  Intimate Partner Violence: Not At Risk (03/27/2021)   Humiliation, Afraid, Rape, and Kick questionnaire    Fear of Current or Ex-Partner: No    Emotionally Abused: No    Physically Abused: No    Sexually Abused: No    Outpatient Medications Prior to Visit  Medication Sig Dispense Refill   Cholecalciferol (VITAMIN D3) 25 MCG (1000 UT) CAPS Take 1 capsule (1,000 Units total) by mouth daily. 60 capsule 3   Prenatal Vit-Fe Fumarate-FA (PNV PRENATAL PLUS MULTIVITAMIN) 27-1 MG TABS Take 1 daily 30 tablet 12   olmesartan (BENICAR) 20 MG tablet Take 1 tablet (20 mg total) by mouth daily. (Patient not taking: Reported on 11/20/2022) 30 tablet 1   benzonatate (TESSALON) 100 MG capsule Take 1 capsule  (100 mg total) by mouth 3 (three) times daily as needed. 30 capsule 0   nitrofurantoin, macrocrystal-monohydrate, (MACROBID) 100 MG capsule Take 1 capsule (100 mg total) by mouth 2 (two) times daily. 10 capsule 0   No facility-administered medications prior to visit.    No Known Allergies  ROS Review of Systems  Constitutional:  Negative for chills and fever.  Eyes:  Negative for visual disturbance.  Respiratory:  Negative for chest tightness and shortness of breath.   Neurological:  Negative for dizziness and headaches.      Objective:    Physical Exam HENT:     Head: Normocephalic.     Mouth/Throat:     Mouth: Mucous membranes are moist.  Cardiovascular:     Rate and Rhythm: Normal rate.     Heart sounds: Normal heart sounds.  Pulmonary:     Effort: Pulmonary effort is normal.     Breath sounds: Normal breath sounds.  Neurological:     Mental Status: She is alert.     BP (!) 146/88 (BP Location: Left Arm)   Pulse 83   Ht 5\' 5"  (1.651 m)   Wt 251 lb 1.3 oz (113.9 kg)   SpO2 96%   BMI 41.78 kg/m  Wt Readings from Last 3 Encounters:  11/20/22 251 lb 1.3 oz (113.9 kg)  05/23/22 256 lb (116.1 kg)  08/19/21 261 lb (118.4 kg)    Lab Results  Component Value Date   TSH 1.300 05/29/2022   Lab Results  Component Value Date   WBC 6.0 05/29/2022   HGB 10.8 (L) 05/29/2022   HCT 35.2 05/29/2022   MCV 70 (L) 05/29/2022   PLT 273 05/29/2022   Lab Results  Component Value Date   NA 139 05/29/2022   K 4.1 05/29/2022   CO2 22 05/29/2022   GLUCOSE 80 05/29/2022   BUN 8 05/29/2022   CREATININE 0.85 05/29/2022   BILITOT 0.3 05/29/2022   ALKPHOS 54 05/29/2022   AST 17 05/29/2022   ALT 15 05/29/2022   PROT 7.5 05/29/2022   ALBUMIN 4.5 05/29/2022   CALCIUM 9.9 05/29/2022   ANIONGAP 10 07/24/2016   EGFR 93 05/29/2022   Lab Results  Component Value Date   CHOL 185 05/29/2022   Lab Results  Component Value Date   HDL 40 05/29/2022   Lab Results  Component  Value Date   LDLCALC 129 (H) 05/29/2022   Lab Results  Component Value Date   TRIG 86 05/29/2022   Lab Results  Component Value Date  CHOLHDL 4.6 (H) 05/29/2022   Lab Results  Component Value Date   HGBA1C 5.7 (H) 05/29/2022      Assessment & Plan:  Primary hypertension Assessment & Plan: Uncontrolled She reports taking  BP medications  and stopped therapy about a week ago Reports feeling like she had her blood pressure under controlled Encouraged to resume therapy on olmesartan 20 mg daily Low-sodium diet with increased physical activity encouraged Encouraged checking ambulatory readings daily and bringing her readings with her to her next appointment She endorses a mild headache rated 4 out of 10 Denies symptoms of blurry vision, chest pain, palpitation, shortness of breath Encouraged to take over-the-counter analgesic for relief of her headache BP Readings from Last 3 Encounters:  11/20/22 (!) 146/88  05/23/22 (!) 152/86  12/15/21 130/80      Vitamin D deficiency -     VITAMIN D 25 Hydroxy (Vit-D Deficiency, Fractures)  Impaired fasting blood sugar -     Hemoglobin A1c  Other hyperlipidemia -     CBC with Differential/Platelet -     CMP14+EGFR -     Lipid panel  Other specified disorders of thyroid -     TSH + free T4    Follow-up: Return in about 1 month (around 12/20/2022) for BP.   Gilmore Laroche, FNP

## 2022-11-27 IMAGING — US US BREAST*L* LIMITED INC AXILLA
1 series · 13 of 25 positions shown · non-contrast
Comparison: Ultrasound LEFT breast dated 09/05/2021

CLINICAL DATA: Follow-up probably benign mass in the LEFT breast.
History of bilateral nipple piercings which occasionally become
infected. At the time of the previous breast ultrasound of
09/05/2021, patient described a tender palpable area of concern in
the periareolar LEFT breast from which purulent drainage was noted 1
month prior to that ultrasound exam.

Patient had new/increasing pain in this area approximately 2 weeks
ago. Patient was prescribed a course of doxycycline and the pain has
improved. Patient denies subsequent drainage in this area.
EXAM:
ULTRASOUND OF THE LEFT BREAST

[Series 1: us breast ltd uni left inc axilla · 13 of 34 slices shown]
[im 1/34]
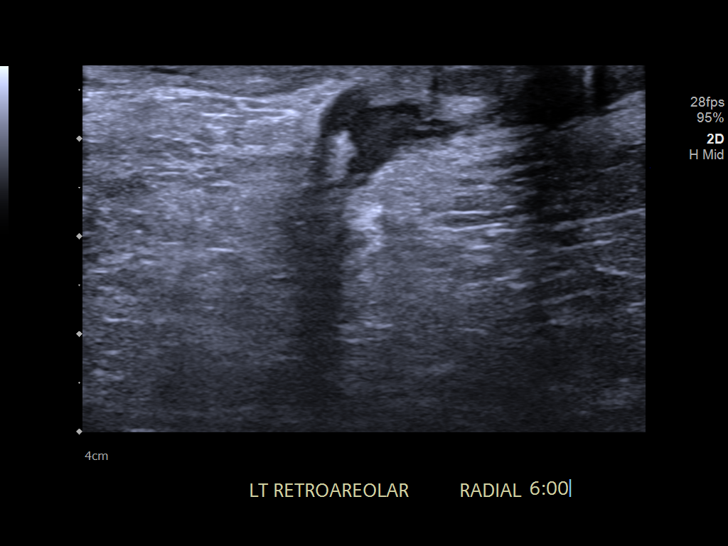
[im 3/34]
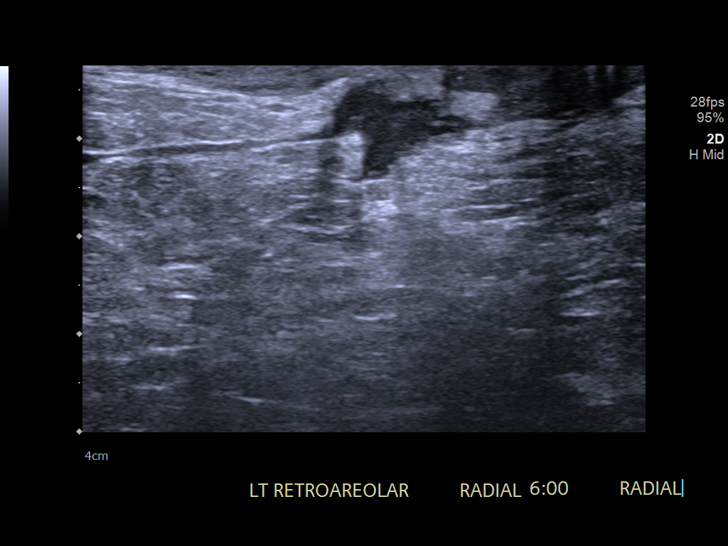
[im 6/34]
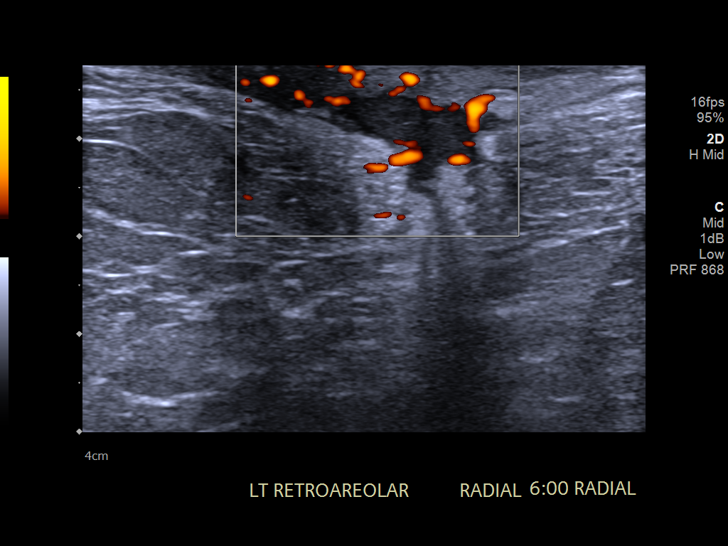
[im 9/34]
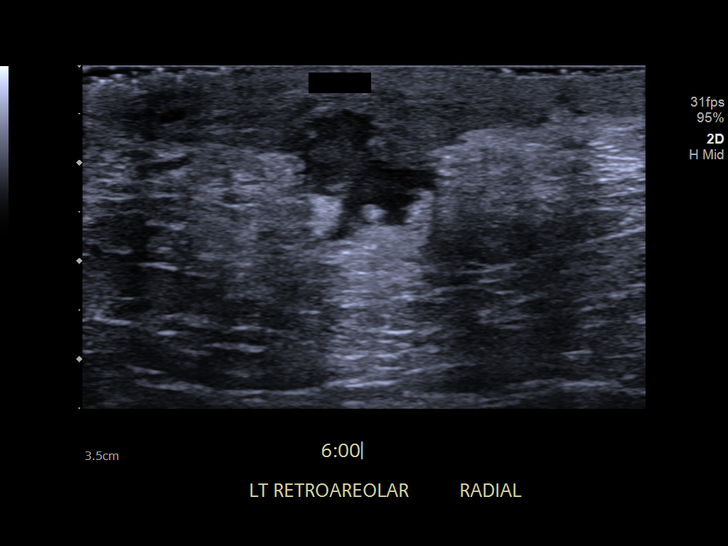
[im 12/34]
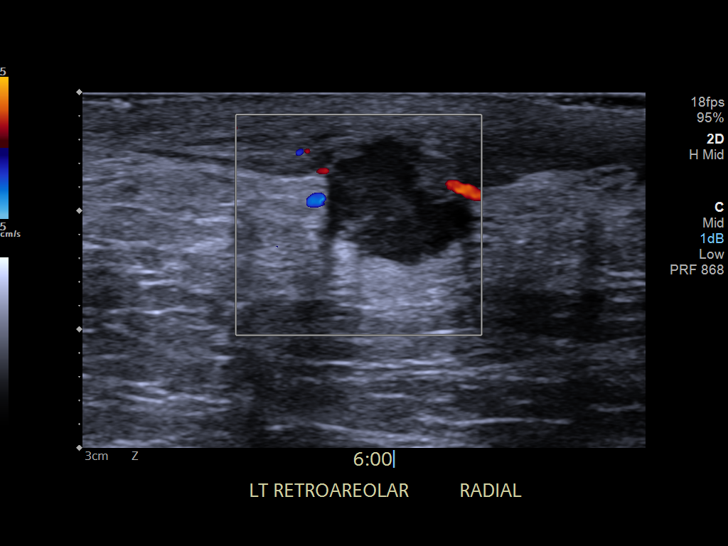
[im 14/34]
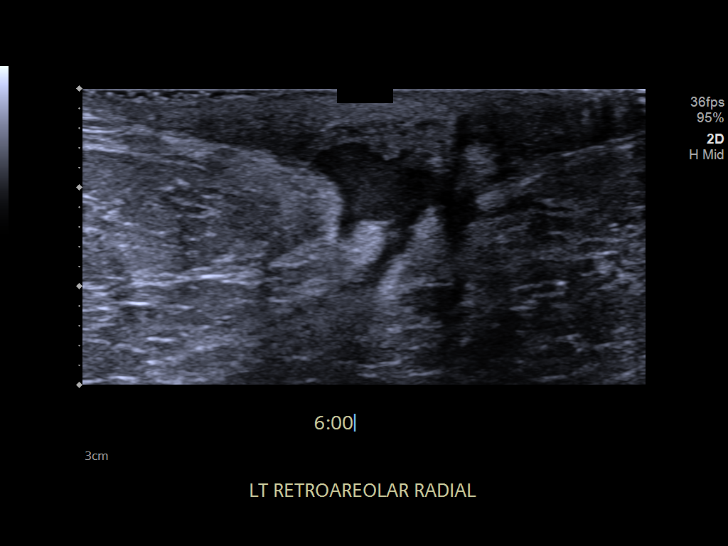
[im 17/34]
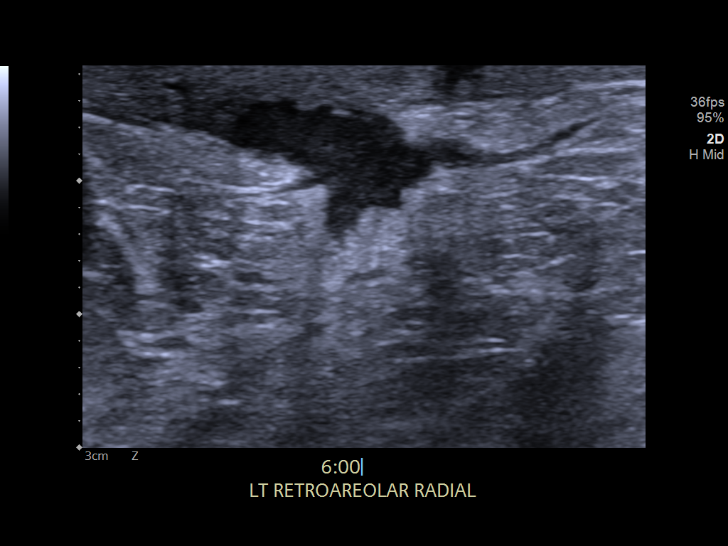
[im 20/34]
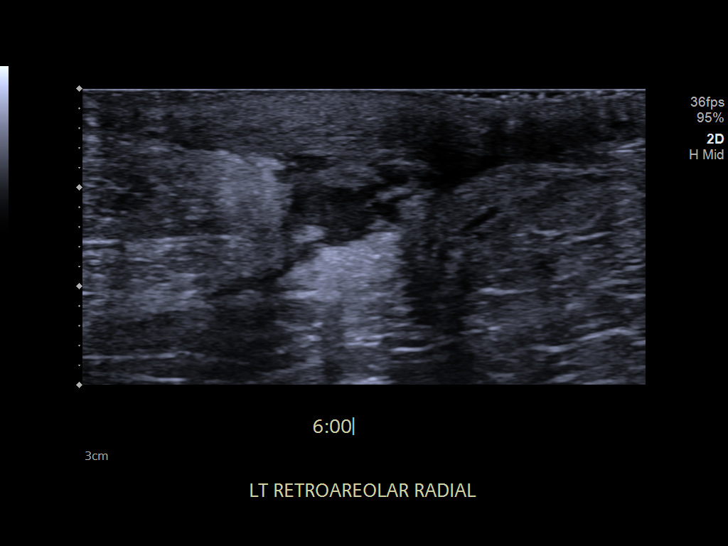
[im 23/34]
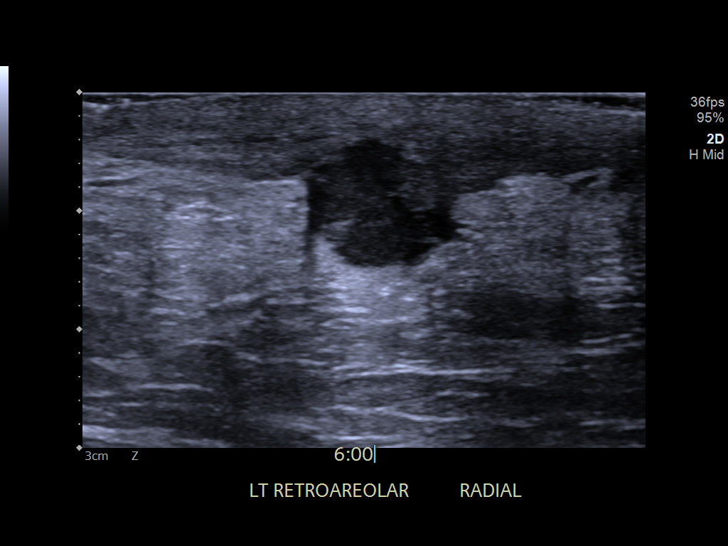
[im 25/34]
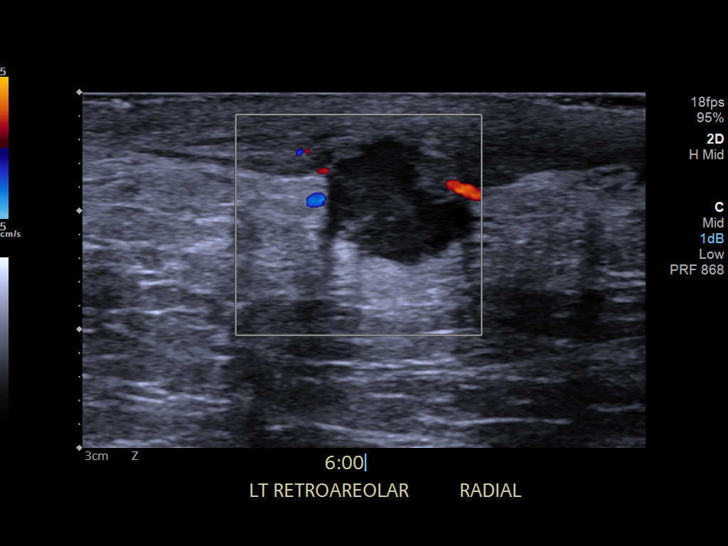
[im 28/34]
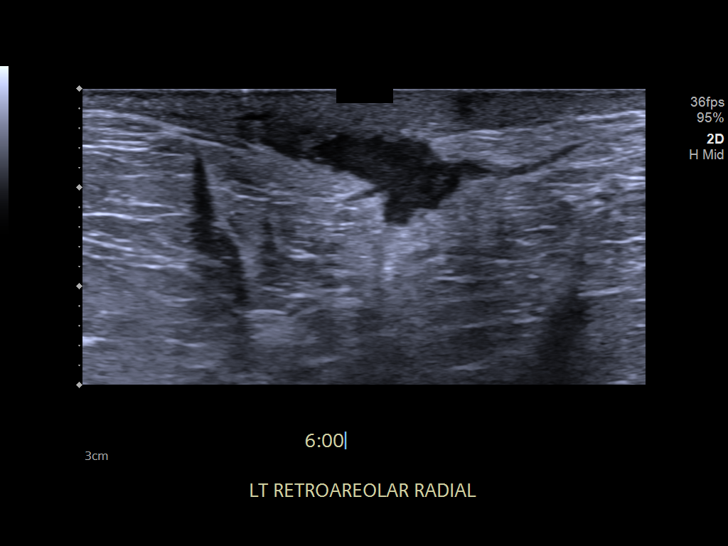
[im 31/34]
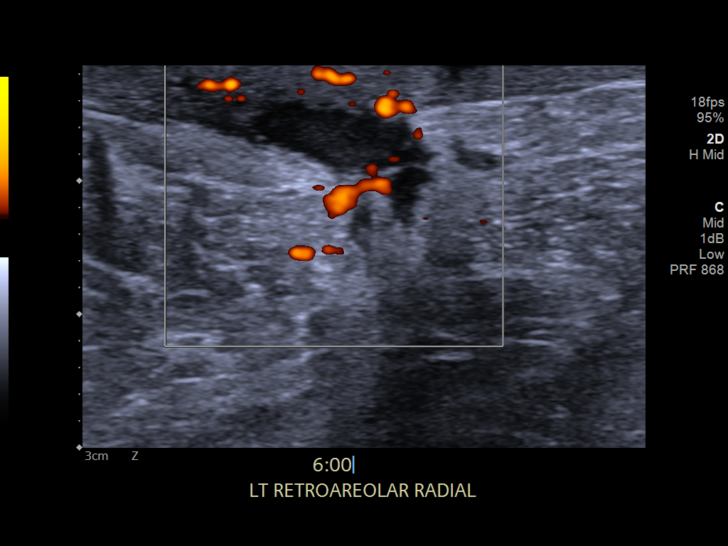
[im 34/34]
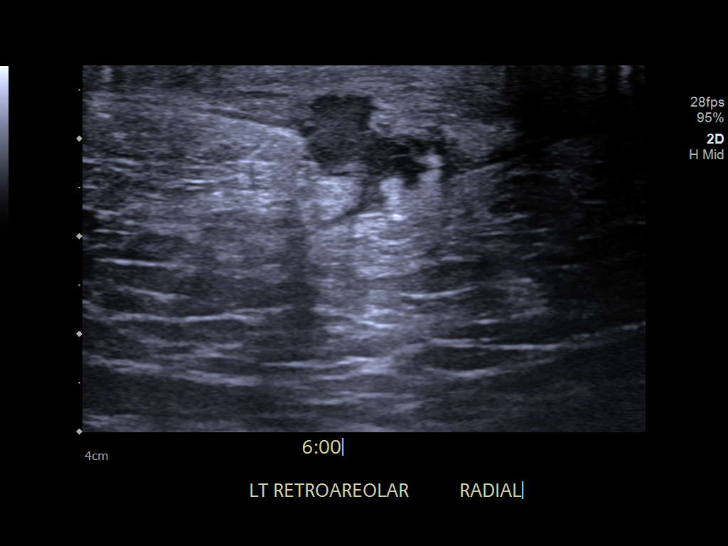

[13 of 25 positions shown; findings below may reference images not displayed]

FINDINGS: On physical exam, there is no skin redness, drainage or retraction
seen.

Targeted ultrasound is performed, now showing a complex hypoechoic
collection in the subareolar LEFT breast, 6 o'clock axis region, at
superficial depth just below the nipple, measuring 15 x 7 x 10 mm,
compatible with developing abscess versus healing phlegmonous
collection.
IMPRESSION: Small developing abscess collection versus healing phlegmonous
collection within the lower subareolar LEFT breast, 6 o'clock axis,
at superficial depth, measuring 15 x 7 x 10 mm. As the pain has
subsided after the most recent course of antibiotics and as there is
no skin redness, heat or drainage noted at this time, I recommend an
additional course of antibiotics and a follow-up ultrasound in 2
weeks to hopefully document resolution.

RECOMMENDATION:
1. Additional course of antibiotics. Given the persistence and
subareolar location, recommend 10 day course of Augmentin or
clindamycin.
2. Follow-up LEFT breast ultrasound in 2 weeks.
3. Patient was informed that ultrasound-guided abscess drainage or a
surgical incision and drainage may become necessary if this
collection persists/enlarges.

These results and recommendations were called by telephone at the
time of interpretation on 12/05/2021 at [DATE] to provider NAZEERI
LIBREBIKES , who verbally acknowledged these results.

I have discussed the findings and recommendations with the patient.
If applicable, a reminder letter will be sent to the patient
regarding the next appointment.

BI-RADS CATEGORY  2: Benign.

## 2022-12-23 ENCOUNTER — Ambulatory Visit: Payer: Medicaid Other | Admitting: Family Medicine

## 2023-01-17 ENCOUNTER — Other Ambulatory Visit: Payer: Self-pay | Admitting: Family Medicine

## 2023-01-17 DIAGNOSIS — D509 Iron deficiency anemia, unspecified: Secondary | ICD-10-CM

## 2023-01-17 NOTE — Progress Notes (Signed)
Please inform the patient to return for laboratory tests to evaluate her iron levels, as her hemoglobin and hematocrit are slightly low. Encourage her to continue taking her vitamin D supplement; although her vitamin D levels have improved, they have not yet reached the target. She should also focus on including more iron-rich foods in her diet, such as red meat, poultry, fish, lentils, beans, and spinach. For vitamin D, she can incorporate foods like fatty fish (e.g., salmon, mackerel), fortified dairy products, and egg yolks. Additionally, her LDL levels have improved but are still below goal. I recommend increasing her intake of fruits, vegetables, whole grains, and lean proteins (such as chicken, fish, beans, and legumes), as well as low-fat dairy products. She should also reduce her consumption of saturated fats, trans fatty acids, and cholesterol. Lastly, aim for at least 30 minutes of brisk walking five days a week.

## 2023-01-20 ENCOUNTER — Ambulatory Visit: Payer: Medicaid Other | Admitting: Family Medicine

## 2023-01-20 ENCOUNTER — Encounter: Payer: Self-pay | Admitting: Family Medicine

## 2023-01-20 VITALS — BP 136/84 | Ht 65.0 in

## 2023-01-20 DIAGNOSIS — R051 Acute cough: Secondary | ICD-10-CM

## 2023-01-20 DIAGNOSIS — I10 Essential (primary) hypertension: Secondary | ICD-10-CM | POA: Diagnosis not present

## 2023-01-20 DIAGNOSIS — R059 Cough, unspecified: Secondary | ICD-10-CM | POA: Insufficient documentation

## 2023-01-20 MED ORDER — OLMESARTAN MEDOXOMIL 20 MG PO TABS: 20.0000 mg | ORAL_TABLET | Freq: Every day | ORAL | 1 refills | Status: DC

## 2023-01-20 NOTE — Patient Instructions (Addendum)
I appreciate the opportunity to provide care to you today!    Follow up:  4 months  Labs: please stop by the lab today to get your blood drawn (BMP)  Non-pharmacological interventions for managing a dry cough  Hydration: Drink Plenty of Fluids: Staying well-hydrated helps keep the throat moist, which can reduce the irritation that causes a dry cough. Warm fluids like herbal teas, warm water with honey, or clear broths are particularly soothing. Humidify the Air: Using a humidifier in your home, especially in the bedroom, can add moisture to the air, which helps prevent the throat from drying out and reduces coughing. Steam Inhalation: Steam Therapy: Inhaling steam from a bowl of hot water or during a hot shower can help soothe the respiratory tract and loosen any mucus, which may relieve a dry cough. Adding a few drops of essential oils like eucalyptus or peppermint may provide additional relief. Throat Lozenges and Honey: Throat Lozenges: Sucking on throat lozenges can help keep the throat moist and soothe irritation. Honey: A spoonful of honey, especially taken before bedtime, can coat the throat and reduce coughing. Honey has natural antibacterial properties and is known to be effective in reducing coughs in both adults and children (over 62 year old).   Attached with your AVS, you will find valuable resources for self-education. I highly recommend dedicating some time to thoroughly examine them.   Please continue to a heart-healthy diet and increase your physical activities. Try to exercise for at least five days a week.    It was a pleasure to see you and I look forward to continuing to work together on your health and well-being. Please do not hesitate to call the office if you need care or have questions about your care.  In case of emergency, please visit the Emergency Department for urgent care, or contact our clinic at [phone] to schedule an appointment. We're here to  help you!   Have a wonderful day and week. With Gratitude, Gilmore Laroche MSN, FNP-BC

## 2023-01-20 NOTE — Progress Notes (Signed)
Established Patient Office Visit  Subjective:  Patient ID: Angela Mcclure, female    DOB: August 19, 1990  Age: 32 y.o. MRN: [id number]  CC:  Chief Complaint  Patient presents with   Hypertension    1 month f/u   Cough    Pt reports a dry cough, since this morning.     HPI Angela Mcclure is a 32 y.o. female  presents for  BP f/u. For the details of today's visit, please refer to the assessment and plan.    Past Medical History:  Diagnosis Date   Anxiety    Asthma    History of gestational hypertension 10/18/2013   Postpartum hypertension 05/14/2015    Past Surgical History:  Procedure Laterality Date   NO PAST SURGERIES      Family History  Problem Relation Age of Onset   Diabetes Other    Hypertension Other    Stroke Other    Diabetes Mother    Hypertension Mother     Social History   Socioeconomic History   Marital status: Significant Other    Spouse name: Not on file   Number of children: 3   Years of education: Not on file   Highest education level: 12th grade  Occupational History   Occupation: Aided    Comment: [employer]   Tobacco Use   Smoking status: Never   Smokeless tobacco: Never  Vaping Use   Vaping status: Never Used  Substance and Sexual Activity   Alcohol use: No   Drug use: Never   Sexual activity: Yes    Birth control/protection: None  Other Topics Concern   Not on file  Social History Narrative   Lives with 3 children father of children    4- [name]    5- [name]   6- [name]       Enjoys: shopping, eating out, relaxing, watching movies       Diet: eats all food groups   Caffeine: sweet tea    Water: at times 16 oz       Wears seat belt   Smoke and carbon monoxide detectors    Does not use phone while driving       Social Determinants of Health   Financial Resource Strain: Low Risk  (03/27/2021)   Overall Financial Resource Strain (CARDIA)    Difficulty of Paying Living Expenses: Not hard at all  Food  Insecurity: No Food Insecurity (03/27/2021)   Hunger Vital Sign    Worried About Running Out of Food in the Last Year: Never true    Ran Out of Food in the Last Year: Never true  Transportation Needs: No Transportation Needs (03/27/2021)   PRAPARE - Administrator, Civil Service (Medical): No    Lack of Transportation (Non-Medical): No  Physical Activity: Insufficiently Active (03/27/2021)   Exercise Vital Sign    Days of Exercise per Week: 3 days    Minutes of Exercise per Session: 20 min  Stress: No Stress Concern Present (03/27/2021)   [employer] of Occupational Health - Occupational Stress Questionnaire    Feeling of Stress : Not at all  Social Connections: Socially Isolated (03/27/2021)   Social Connection and Isolation Panel [NHANES]    Frequency of Communication with Friends and Family: More than three times a week    Frequency of Social Gatherings with Friends and Family: Three times a week    Attends Religious Services: Never  Active Member of Clubs or Organizations: No    Attends Banker Meetings: Patient declined    Marital Status: Never married  Intimate Partner Violence: Not At Risk (03/27/2021)   Humiliation, Afraid, Rape, and Kick questionnaire    Fear of Current or Ex-Partner: No    Emotionally Abused: No    Physically Abused: No    Sexually Abused: No    Outpatient Medications Prior to Visit  Medication Sig Dispense Refill   Cholecalciferol (VITAMIN D3) 25 MCG (1000 UT) CAPS Take 1 capsule (1,000 Units total) by mouth daily. 60 capsule 3   olmesartan (BENICAR) 20 MG tablet Take 1 tablet (20 mg total) by mouth daily. 30 tablet 1   Prenatal Vit-Fe Fumarate-FA (PNV PRENATAL PLUS MULTIVITAMIN) 27-1 MG TABS Take 1 daily 30 tablet 12   No facility-administered medications prior to visit.    No Known Allergies  ROS Review of Systems  Constitutional:  Negative for chills and fever.  Eyes:  Negative for visual disturbance.   Respiratory:  Negative for chest tightness and shortness of breath.   Neurological:  Negative for dizziness and headaches.      Objective:    Physical Exam HENT:     Head: Normocephalic.     Mouth/Throat:     Mouth: Mucous membranes are moist.  Cardiovascular:     Rate and Rhythm: Normal rate.     Heart sounds: Normal heart sounds.  Pulmonary:     Effort: Pulmonary effort is normal.     Breath sounds: Normal breath sounds.  Neurological:     Mental Status: She is alert.     BP 136/84 (BP Location: Left Arm)   Ht 5\' 5"  (1.651 m)   BMI 41.78 kg/m  Wt Readings from Last 3 Encounters:  11/20/22 251 lb 1.3 oz (113.9 kg)  05/23/22 256 lb (116.1 kg)  08/19/21 261 lb (118.4 kg)    Lab Results  Component Value Date   TSH 1.540 01/16/2023   Lab Results  Component Value Date   WBC 7.1 01/16/2023   HGB 10.2 (L) 01/16/2023   HCT 33.2 (L) 01/16/2023   MCV 71 (L) 01/16/2023   PLT 261 01/16/2023   Lab Results  Component Value Date   NA 138 01/16/2023   K 4.1 01/16/2023   CO2 24 01/16/2023   GLUCOSE 84 01/16/2023   BUN 7 01/16/2023   CREATININE 0.87 01/16/2023   BILITOT <0.2 01/16/2023   ALKPHOS 63 01/16/2023   AST 12 01/16/2023   ALT 12 01/16/2023   PROT 7.5 01/16/2023   ALBUMIN 4.3 01/16/2023   CALCIUM 9.7 01/16/2023   ANIONGAP 10 07/24/2016   EGFR 90 01/16/2023   Lab Results  Component Value Date   CHOL 175 01/16/2023   Lab Results  Component Value Date   HDL 39 (L) 01/16/2023   Lab Results  Component Value Date   LDLCALC 117 (H) 01/16/2023   Lab Results  Component Value Date   TRIG 102 01/16/2023   Lab Results  Component Value Date   CHOLHDL 4.5 (H) 01/16/2023   Lab Results  Component Value Date   HGBA1C 5.6 01/16/2023      Assessment & Plan:  Primary hypertension Assessment & Plan: Controlled Asymptomatic today in the clinic Reports not taking medication as prescribed Encouraged medication compliance Low-sodium diet with increased  physical activity encourage Refill sent to the pharmacy Pending BMP BP Readings from Last 3 Encounters:  01/20/23 136/84  11/20/22 (!) 146/88  05/23/22 Marland Kitchen)  152/86     Orders: -     BMP8+EGFR -     Olmesartan Medoxomil; Take 1 tablet (20 mg total) by mouth daily.  Dispense: 90 tablet; Refill: 1  Acute cough Assessment & Plan: Encourage non-pharmacological interventions for managing a dry cough  Hydration: Drink Plenty of Fluids: Staying well-hydrated helps keep the throat moist, which can reduce the irritation that causes a dry cough. Warm fluids like herbal teas, warm water with honey, or clear broths are particularly soothing. Humidify the Air: Using a humidifier in your home, especially in the bedroom, can add moisture to the air, which helps prevent the throat from drying out and reduces coughing. Steam Inhalation: Steam Therapy: Inhaling steam from a bowl of hot water or during a hot shower can help soothe the respiratory tract and loosen any mucus, which may relieve a dry cough. Adding a few drops of essential oils like eucalyptus or peppermint may provide additional relief. Throat Lozenges and Honey: Throat Lozenges: Sucking on throat lozenges can help keep the throat moist and soothe irritation. Honey: A spoonful of honey, especially taken before bedtime, can coat the throat and reduce coughing. Honey has natural antibacterial properties and is known to be effective in reducing coughs in both adults and children (over 70 year old).    Note: This chart has been completed using Engineer, civil (consulting) software, and while attempts have been made to ensure accuracy, certain words and phrases may not be transcribed as intended.    Follow-up: Return in about 4 months (around 05/22/2023).   Gilmore Laroche, FNP

## 2023-01-20 NOTE — Assessment & Plan Note (Signed)
Controlled Asymptomatic today in the clinic Reports not taking medication as prescribed Encouraged medication compliance Low-sodium diet with increased physical activity encourage Refill sent to the pharmacy Pending BMP BP Readings from Last 3 Encounters:  01/20/23 136/84  11/20/22 (!) 146/88  05/23/22 (!) 152/86

## 2023-01-20 NOTE — Assessment & Plan Note (Addendum)
Encourage non-pharmacological interventions for managing a dry cough  Hydration: Drink Plenty of Fluids: Staying well-hydrated helps keep the throat moist, which can reduce the irritation that causes a dry cough. Warm fluids like herbal teas, warm water with honey, or clear broths are particularly soothing. Humidify the Air: Using a humidifier in your home, especially in the bedroom, can add moisture to the air, which helps prevent the throat from drying out and reduces coughing. Steam Inhalation: Steam Therapy: Inhaling steam from a bowl of hot water or during a hot shower can help soothe the respiratory tract and loosen any mucus, which may relieve a dry cough. Adding a few drops of essential oils like eucalyptus or peppermint may provide additional relief. Throat Lozenges and Honey: Throat Lozenges: Sucking on throat lozenges can help keep the throat moist and soothe irritation. Honey: A spoonful of honey, especially taken before bedtime, can coat the throat and reduce coughing. Honey has natural antibacterial properties and is known to be effective in reducing coughs in both adults and children (over 62 year old).

## 2023-01-21 ENCOUNTER — Other Ambulatory Visit: Payer: Self-pay | Admitting: Family Medicine

## 2023-01-21 DIAGNOSIS — D508 Other iron deficiency anemias: Secondary | ICD-10-CM

## 2023-01-21 LAB — IRON,TIBC AND FERRITIN PANEL
Ferritin: 15 ng/mL (ref 15–150)
Iron Saturation: 10 % — ABNORMAL LOW (ref 15–55)
Iron: 37 ug/dL (ref 27–159)
Total Iron Binding Capacity: 378 ug/dL (ref 250–450)
UIBC: 341 ug/dL (ref 131–425)

## 2023-01-21 MED ORDER — IRON (FERROUS SULFATE) 325 (65 FE) MG PO TABS: 325.0000 mg | ORAL_TABLET | Freq: Every day | ORAL | 1 refills | Status: DC

## 2023-01-21 NOTE — Progress Notes (Signed)
Please inform the patient that I have initiated a daily iron supplement. It is recommended to take the supplement with orange juice to enhance absorption. Since iron supplements can cause constipation, I advise increasing the intake of high-fiber foods, such as whole grains, fruits, vegetables, and legumes. Additionally, I recommend incorporating more iron-rich foods into her diet, such as red meat, poultry, fish, beans, lentils, spinach, and fortified cereals.

## 2023-05-22 ENCOUNTER — Ambulatory Visit: Payer: Medicaid Other | Admitting: Family Medicine

## 2023-05-27 ENCOUNTER — Ambulatory Visit: Payer: Self-pay | Admitting: Family Medicine

## 2023-06-29 ENCOUNTER — Ambulatory Visit (INDEPENDENT_AMBULATORY_CARE_PROVIDER_SITE_OTHER): Payer: Medicaid Other | Admitting: Family Medicine

## 2023-06-29 ENCOUNTER — Encounter: Payer: Self-pay | Admitting: Family Medicine

## 2023-06-29 ENCOUNTER — Other Ambulatory Visit: Payer: Self-pay | Admitting: Family Medicine

## 2023-06-29 VITALS — BP 140/82 | HR 76 | Ht 65.0 in | Wt 257.1 lb

## 2023-06-29 DIAGNOSIS — I10 Essential (primary) hypertension: Secondary | ICD-10-CM | POA: Diagnosis not present

## 2023-06-29 DIAGNOSIS — H6123 Impacted cerumen, bilateral: Secondary | ICD-10-CM

## 2023-06-29 NOTE — Assessment & Plan Note (Signed)
 Uncontrolled Blood Pressure The patient reports medication noncompliance, stating that she takes her blood pressure medication as needed and admits to not having taken her medication today. She was encouraged to adhere to her medication regimen, follow a low-sodium diet, and increase physical activity. She was also advised to follow up in two weeks for a nurse visit and blood pressure recheck. BP Readings from Last 3 Encounters:  06/29/23 (!) 140/82  01/20/23 136/84  11/20/22 (!) 146/88

## 2023-06-29 NOTE — Assessment & Plan Note (Signed)
 Ear irrigation performed. Encouraged to use the over-the-counter Debrox earwax removal kit. Encouraged to follow up if she experiences severe or persistent ear pain, hearing loss, drainage from the ear, pain after injury or trauma, fever, or swelling.

## 2023-06-29 NOTE — Progress Notes (Signed)
 Acute Office Visit  Subjective:    Patient ID: Angela Mcclure, female    DOB: 10/03/1990, 33 y.o.   MRN: [id number]  Chief Complaint  Patient presents with   Acute Visit    Ear pain x 1 week.     HPI The patient is in today with complaints of intermittent, throbbing ear pain that usually lasts a few seconds and then subsides.No recent injury or trauma reported.No fever, chills, or purulent discharge reported.No hearing loss reported.   Past Medical History:  Diagnosis Date   Anxiety    Asthma    History of gestational hypertension 10/18/2013   Postpartum hypertension 05/14/2015    Past Surgical History:  Procedure Laterality Date   NO PAST SURGERIES      Family History  Problem Relation Age of Onset   Diabetes Other    Hypertension Other    Stroke Other    Diabetes Mother    Hypertension Mother     Social History   Socioeconomic History   Marital status: Significant Other    Spouse name: Not on file   Number of children: 3   Years of education: Not on file   Highest education level: 12th grade  Occupational History   Occupation: Aided    Comment: [employer]   Tobacco Use   Smoking status: Never   Smokeless tobacco: Never  Vaping Use   Vaping status: Never Used  Substance and Sexual Activity   Alcohol use: No   Drug use: Never   Sexual activity: Yes    Birth control/protection: None  Other Topics Concern   Not on file  Social History Narrative   Lives with 3 children father of children    4- [name]    5- [name]   6- [name]       Enjoys: shopping, eating out, relaxing, watching movies       Diet: eats all food groups   Caffeine: sweet tea    Water: at times 16 oz       Wears seat belt   Smoke and carbon monoxide detectors    Does not use phone while driving       Social Drivers of Corporate Investment Banker Strain: Low Risk  (03/27/2021)   Overall Financial Resource Strain (CARDIA)    Difficulty of Paying Living  Expenses: Not hard at all  Food Insecurity: No Food Insecurity (03/27/2021)   Hunger Vital Sign    Worried About Running Out of Food in the Last Year: Never true    Ran Out of Food in the Last Year: Never true  Transportation Needs: No Transportation Needs (03/27/2021)   PRAPARE - Administrator, Civil Service (Medical): No    Lack of Transportation (Non-Medical): No  Physical Activity: Insufficiently Active (03/27/2021)   Exercise Vital Sign    Days of Exercise per Week: 3 days    Minutes of Exercise per Session: 20 min  Stress: No Stress Concern Present (03/27/2021)   [employer] of Occupational Health - Occupational Stress Questionnaire    Feeling of Stress : Not at all  Social Connections: Socially Isolated (03/27/2021)   Social Connection and Isolation Panel [NHANES]    Frequency of Communication with Friends and Family: More than three times a week    Frequency of Social Gatherings with Friends and Family: Three times a week    Attends Religious Services: Never    Active Member of Clubs  or Organizations: No    Attends Banker Meetings: Patient declined    Marital Status: Never married  Intimate Partner Violence: Not At Risk (03/27/2021)   Humiliation, Afraid, Rape, and Kick questionnaire    Fear of Current or Ex-Partner: No    Emotionally Abused: No    Physically Abused: No    Sexually Abused: No    Outpatient Medications Prior to Visit  Medication Sig Dispense Refill   Cholecalciferol  (VITAMIN D3) 25 MCG (1000 UT) CAPS Take 1 capsule (1,000 Units total) by mouth daily. 60 capsule 3   Iron , Ferrous Sulfate , 325 (65 Fe) MG TABS Take 325 mg by mouth daily. 30 tablet 1   olmesartan  (BENICAR ) 20 MG tablet Take 1 tablet (20 mg total) by mouth daily. 90 tablet 1   No facility-administered medications prior to visit.    No Known Allergies  Review of Systems  Constitutional:  Negative for chills and fever.  HENT:  Positive for ear pain.  Negative for ear discharge and hearing loss.   Eyes:  Negative for visual disturbance.  Respiratory:  Negative for chest tightness and shortness of breath.   Neurological:  Negative for dizziness and headaches.       Objective:    Physical Exam HENT:     Head: Normocephalic.     Right Ear: There is impacted cerumen.     Left Ear: There is impacted cerumen.     Mouth/Throat:     Mouth: Mucous membranes are moist.  Cardiovascular:     Rate and Rhythm: Normal rate.     Heart sounds: Normal heart sounds.  Pulmonary:     Effort: Pulmonary effort is normal.     Breath sounds: Normal breath sounds.  Neurological:     Mental Status: She is alert.     BP (!) 140/82   Pulse 76   Ht 5' 5 (1.651 m)   Wt 257 lb 1.9 oz (116.6 kg)   LMP 06/22/2023   SpO2 98%   BMI 42.79 kg/m  Wt Readings from Last 3 Encounters:  06/29/23 257 lb 1.9 oz (116.6 kg)  11/20/22 251 lb 1.3 oz (113.9 kg)  05/23/22 256 lb (116.1 kg)       Assessment & Plan:  Impacted cerumen of both ears Assessment & Plan: Ear irrigation performed. Encouraged to use the over-the-counter Debrox earwax removal kit. Encouraged to follow up if she experiences severe or persistent ear pain, hearing loss, drainage from the ear, pain after injury or trauma, fever, or swelling.    Primary hypertension Assessment & Plan: Uncontrolled Blood Pressure The patient reports medication noncompliance, stating that she takes her blood pressure medication as needed and admits to not having taken her medication today. She was encouraged to adhere to her medication regimen, follow a low-sodium diet, and increase physical activity. She was also advised to follow up in two weeks for a nurse visit and blood pressure recheck. BP Readings from Last 3 Encounters:  06/29/23 (!) 140/82  01/20/23 136/84  11/20/22 (!) 146/88      Note: This chart has been completed using [employer], and while attempts have been made  to ensure accuracy, certain words and phrases may not be transcribed as intended.    Paysley Poplar, FNP

## 2023-06-29 NOTE — Patient Instructions (Addendum)
 I appreciate the opportunity to provide care to you today!    Follow up: 4 weeks    I recommend using the OTC Debrox earwax removal kit to remove cerumen buildup.  Please follow up if experiencing severe or persistent ear pain, hearing loss, drainage from the ear, pain after injury or trauma, fever, or swelling.      Please continue to a heart-healthy diet and increase your physical activities. Try to exercise for at least five days a week.    It was a pleasure to see you and I look forward to continuing to work together on your health and well-being. Please do not hesitate to call the office if you need care or have questions about your care.  In case of emergency, please visit the Emergency Department for urgent care, or contact our clinic at [phone] to schedule an appointment. We're here to help you!   Have a wonderful day and week. With Gratitude, Revere Maahs MSN, FNP-BC

## 2023-08-10 ENCOUNTER — Ambulatory Visit: Payer: Medicaid Other | Admitting: Family Medicine

## 2023-09-24 ENCOUNTER — Ambulatory Visit: Payer: Medicaid Other | Admitting: Family Medicine

## 2023-09-28 ENCOUNTER — Telehealth: Admitting: Nurse Practitioner

## 2023-09-28 DIAGNOSIS — S21039A Puncture wound without foreign body of unspecified breast, initial encounter: Secondary | ICD-10-CM

## 2023-09-28 DIAGNOSIS — L089 Local infection of the skin and subcutaneous tissue, unspecified: Secondary | ICD-10-CM | POA: Diagnosis not present

## 2023-09-28 MED ORDER — AMOXICILLIN-POT CLAVULANATE 875-125 MG PO TABS: 1.0000 | ORAL_TABLET | Freq: Two times a day (BID) | ORAL | 0 refills | Status: DC

## 2023-09-28 MED ORDER — SULFAMETHOXAZOLE-TRIMETHOPRIM 800-160 MG PO TABS: 1.0000 | ORAL_TABLET | Freq: Two times a day (BID) | ORAL | 0 refills | Status: AC

## 2023-09-28 NOTE — Progress Notes (Signed)
 Virtual Visit Consent   Angela Mcclure, you are scheduled for a virtual visit with a [facility] provider today. Just as with appointments in the office, your consent must be obtained to participate. Your consent will be active for this visit and any virtual visit you may have with one of our providers in the next 365 days. If you have a MyChart account, a copy of this consent can be sent to you electronically.  As this is a virtual visit, video technology does not allow for your provider to perform a traditional examination. This may limit your provider's ability to fully assess your condition. If your provider identifies any concerns that need to be evaluated in person or the need to arrange testing (such as labs, EKG, etc.), we will make arrangements to do so. Although advances in technology are sophisticated, we cannot ensure that it will always work on either your end or our end. If the connection with a video visit is poor, the visit may have to be switched to a telephone visit. With either a video or telephone visit, we are not always able to ensure that we have a secure connection.  By engaging in this virtual visit, you consent to the provision of healthcare and authorize for your insurance to be billed (if applicable) for the services provided during this visit. Depending on your insurance coverage, you may receive a charge related to this service.  I need to obtain your verbal consent now. Are you willing to proceed with your visit today? Angela Mcclure has provided verbal consent on 09/28/2023 for a virtual visit (video or telephone). Viviano Simas, FNP  Date: 09/28/2023 11:43 AM   Virtual Visit via Video Note   I, Viviano Simas, connected with  Angela Mcclure  ([id number], 1990-11-19) on 09/28/23 at 11:45 AM EDT by a video-enabled telemedicine application and verified that I am speaking with the correct person using two identifiers.  Location: Patient: Virtual Visit Location Patient:  Home Provider: Virtual Visit Location Provider: Home Office   I discussed the limitations of evaluation and management by telemedicine and the availability of in person appointments. The patient expressed understanding and agreed to proceed.    History of Present Illness: Angela Mcclure is a 33 y.o. who identifies as a female who was assigned female at birth, and is being seen today for an infection in her left nipple piercing.   She had it pierced one month ago  She still has the piercing in   The area is painful to touch  No noted drainage or redness   Denies a fever or other associated symptoms   On review of chart she has had piercing in the past with multiple infections associated with it  Augmentin has not been successful in the past  Patient denies known history of MRSA   Problems:  Patient Active Problem List   Diagnosis Date Noted   Impacted cerumen of both ears 06/29/2023   Cough 01/20/2023   Menorrhagia with regular cycle 08/19/2021   Mass of lower outer quadrant of left breast 08/19/2021   Infected pierced nipple 07/12/2021   Mass of nipple 07/12/2021   Cellulitis 04/14/2021   Anxiety 04/17/2020   Nipple infection in female 01/18/2020   Iron deficiency anemia 08/24/2019   Fatigue 07/12/2019   Family history of diabetes mellitus in mother 07/12/2019   Vitamin D deficiency 07/12/2019   Hypertension 07/12/2019   Morbid obesity (HCC) 10/18/2013   Asthma 08/18/2012  Allergies: No Known Allergies Medications:  Current Outpatient Medications:    Cholecalciferol (VITAMIN D3) 25 MCG (1000 UT) CAPS, Take 1 capsule (1,000 Units total) by mouth daily., Disp: 60 capsule, Rfl: 3   Iron, Ferrous Sulfate, 325 (65 Fe) MG TABS, Take 325 mg by mouth daily., Disp: 30 tablet, Rfl: 1   olmesartan (BENICAR) 20 MG tablet, Take 1 tablet (20 mg total) by mouth daily., Disp: 90 tablet, Rfl: 1  Observations/Objective: Patient is well-developed, well-nourished in no acute distress.   Resting comfortably  at home.  Head is normocephalic, atraumatic.  No labored breathing.  Speech is clear and coherent with logical content.  Patient is alert and oriented at baseline.    Assessment and Plan:  1. Skin infection  2. Piercing of nipple Advised removal of piercing if no improvement on antibiotics and follow up in person for consideration of additional interventions   Meds ordered this encounter  Medications   DISCONTD: amoxicillin-clavulanate (AUGMENTIN) 875-125 MG tablet    Sig: Take 1 tablet by mouth 2 (two) times daily.    Dispense:  20 tablet    Refill:  0   sulfamethoxazole-trimethoprim (BACTRIM DS) 800-160 MG tablet    Sig: Take 1 tablet by mouth 2 (two) times daily for 10 days.    Dispense:  20 tablet    Refill:  0    Please cancel previous Augmentin Rx this was unsuccessful in the past for patient      Follow Up Instructions: I discussed the assessment and treatment plan with the patient. The patient was provided an opportunity to ask questions and all were answered. The patient agreed with the plan and demonstrated an understanding of the instructions.  A copy of instructions were sent to the patient via MyChart unless otherwise noted below.    The patient was advised to call back or seek an in-person evaluation if the symptoms worsen or if the condition fails to improve as anticipated.    Mardene Shake, FNP

## 2023-11-26 ENCOUNTER — Encounter: Payer: Self-pay | Admitting: Family Medicine

## 2023-11-26 ENCOUNTER — Ambulatory Visit: Admitting: Family Medicine

## 2023-11-26 VITALS — BP 142/78 | HR 80 | Resp 16 | Ht 65.0 in | Wt 259.0 lb

## 2023-11-26 DIAGNOSIS — D508 Other iron deficiency anemias: Secondary | ICD-10-CM | POA: Diagnosis not present

## 2023-11-26 DIAGNOSIS — I10 Essential (primary) hypertension: Secondary | ICD-10-CM | POA: Diagnosis not present

## 2023-11-26 DIAGNOSIS — E7849 Other hyperlipidemia: Secondary | ICD-10-CM

## 2023-11-26 DIAGNOSIS — E559 Vitamin D deficiency, unspecified: Secondary | ICD-10-CM

## 2023-11-26 DIAGNOSIS — R7301 Impaired fasting glucose: Secondary | ICD-10-CM

## 2023-11-26 DIAGNOSIS — E038 Other specified hypothyroidism: Secondary | ICD-10-CM

## 2023-11-26 LAB — BMP8+EGFR
BUN/Creatinine Ratio: 11 (ref 9–23)
BUN: 10 mg/dL (ref 6–20)
CO2: 19 mmol/L — ABNORMAL LOW (ref 20–29)
Calcium: 9.4 mg/dL (ref 8.7–10.2)
Chloride: 103 mmol/L (ref 96–106)
Creatinine, Ser: 0.9 mg/dL (ref 0.57–1.00)
Glucose: 81 mg/dL (ref 70–99)
Potassium: 4.1 mmol/L (ref 3.5–5.2)
Sodium: 139 mmol/L (ref 134–144)
eGFR: 86 mL/min/{1.73_m2} (ref >59)

## 2023-11-26 NOTE — Progress Notes (Signed)
 Established Patient Office Visit  Subjective:  Patient ID: Angela Mcclure, female    DOB: 22-Feb-1991  Age: 33 y.o. MRN: [id number]  CC:  Chief Complaint  Patient presents with   Follow-up    Wants to see if her iron  has improved     HPI Angela Mcclure is a 33 y.o. female with past medical history of iron  deficiency anemia, hypertension presents for f/u of  chronic medical conditions. For the details of today's visit, please refer to the assessment and plan.     Past Medical History:  Diagnosis Date   Anxiety    Asthma    History of gestational hypertension 10/18/2013   Postpartum hypertension 05/14/2015    Past Surgical History:  Procedure Laterality Date   NO PAST SURGERIES      Family History  Problem Relation Age of Onset   Diabetes Other    Hypertension Other    Stroke Other    Diabetes Mother    Hypertension Mother     Social History   Socioeconomic History   Marital status: Significant Other    Spouse name: Not on file   Number of children: 3   Years of education: Not on file   Highest education level: 12th grade  Occupational History   Occupation: Aided    Comment: [employer]   Tobacco Use   Smoking status: Never   Smokeless tobacco: Never  Vaping Use   Vaping status: Never Used  Substance and Sexual Activity   Alcohol use: No   Drug use: Never   Sexual activity: Yes    Birth control/protection: None  Other Topics Concern   Not on file  Social History Narrative   Lives with 3 children father of children    4- [name]    5- [name]   6- [name]       Enjoys: shopping, eating out, relaxing, watching movies       Diet: eats all food groups   Caffeine: sweet tea    Water: at times 16 oz       Wears seat belt   Smoke and carbon monoxide detectors    Does not use phone while driving       Social Drivers of Corporate investment banker Strain: Low Risk  (03/27/2021)   Overall Financial Resource Strain (CARDIA)     Difficulty of Paying Living Expenses: Not hard at all  Food Insecurity: No Food Insecurity (03/27/2021)   Hunger Vital Sign    Worried About Running Out of Food in the Last Year: Never true    Ran Out of Food in the Last Year: Never true  Transportation Needs: No Transportation Needs (03/27/2021)   PRAPARE - Administrator, Civil Service (Medical): No    Lack of Transportation (Non-Medical): No  Physical Activity: Insufficiently Active (03/27/2021)   Exercise Vital Sign    Days of Exercise per Week: 3 days    Minutes of Exercise per Session: 20 min  Stress: No Stress Concern Present (03/27/2021)   [employer] of Occupational Health - Occupational Stress Questionnaire    Feeling of Stress : Not at all  Social Connections: Socially Isolated (03/27/2021)   Social Connection and Isolation Panel    Frequency of Communication with Friends and Family: More than three times a week    Frequency of Social Gatherings with Friends and Family: Three times a week    Attends Religious Services: Never  Active Member of Clubs or Organizations: No    Attends Banker Meetings: Patient declined    Marital Status: Never married  Intimate Partner Violence: Not At Risk (03/27/2021)   Humiliation, Afraid, Rape, and Kick questionnaire    Fear of Current or Ex-Partner: No    Emotionally Abused: No    Physically Abused: No    Sexually Abused: No    Outpatient Medications Prior to Visit  Medication Sig Dispense Refill   Iron , Ferrous Sulfate , 325 (65 Fe) MG TABS Take 325 mg by mouth daily. 30 tablet 1   olmesartan  (BENICAR ) 20 MG tablet Take 1 tablet (20 mg total) by mouth daily. 90 tablet 1   Cholecalciferol  (VITAMIN D3) 25 MCG (1000 UT) CAPS Take 1 capsule (1,000 Units total) by mouth daily. (Patient not taking: Reported on 11/26/2023) 60 capsule 3   No facility-administered medications prior to visit.    No Known Allergies  ROS Review of Systems  Constitutional:   Negative for chills and fever.  Eyes:  Negative for visual disturbance.  Respiratory:  Negative for chest tightness and shortness of breath.   Neurological:  Negative for dizziness and headaches.      Objective:    Physical Exam HENT:     Head: Normocephalic.     Mouth/Throat:     Mouth: Mucous membranes are moist.   Cardiovascular:     Rate and Rhythm: Normal rate.     Heart sounds: Normal heart sounds.  Pulmonary:     Effort: Pulmonary effort is normal.     Breath sounds: Normal breath sounds.   Neurological:     Mental Status: She is alert.     BP (!) 142/78   Pulse 80   Resp 16   Ht 5' 5 (1.651 m)   Wt 259 lb (117.5 kg)   SpO2 98%   BMI 43.10 kg/m  Wt Readings from Last 3 Encounters:  11/26/23 259 lb (117.5 kg)  06/29/23 257 lb 1.9 oz (116.6 kg)  11/20/22 251 lb 1.3 oz (113.9 kg)    Lab Results  Component Value Date   TSH 1.540 01/16/2023   Lab Results  Component Value Date   WBC 7.1 01/16/2023   HGB 10.2 (L) 01/16/2023   HCT 33.2 (L) 01/16/2023   MCV 71 (L) 01/16/2023   PLT 261 01/16/2023   Lab Results  Component Value Date   NA 139 11/25/2023   K 4.1 11/25/2023   CO2 19 (L) 11/25/2023   GLUCOSE 81 11/25/2023   BUN 10 11/25/2023   CREATININE 0.90 11/25/2023   BILITOT <0.2 01/16/2023   ALKPHOS 63 01/16/2023   AST 12 01/16/2023   ALT 12 01/16/2023   PROT 7.5 01/16/2023   ALBUMIN 4.3 01/16/2023   CALCIUM 9.4 11/25/2023   ANIONGAP 10 07/24/2016   EGFR 86 11/25/2023   Lab Results  Component Value Date   CHOL 175 01/16/2023   Lab Results  Component Value Date   HDL 39 (L) 01/16/2023   Lab Results  Component Value Date   LDLCALC 117 (H) 01/16/2023   Lab Results  Component Value Date   TRIG 102 01/16/2023   Lab Results  Component Value Date   CHOLHDL 4.5 (H) 01/16/2023   Lab Results  Component Value Date   HGBA1C 5.6 01/16/2023      Assessment & Plan:  Primary hypertension Assessment & Plan: Uncontrolled Blood  Pressure The patient reports medication noncompliance, stating that she takes her blood pressure medication as  needed and admits to not having taken her medication today. She was encouraged to adhere to her medication regimen, follow a low-sodium diet, and increase physical activity. She was also advised to follow up in two weeks for a nurse visit and blood pressure recheck. BP Readings from Last 3 Encounters:  11/26/23 (!) 142/78  06/29/23 (!) 140/82  01/20/23 136/84      Other iron  deficiency anemia Assessment & Plan: The patient reports taking her iron  supplement on an as-needed basis but has been increasing her intake of iron -rich foods such as eggs, chicken, beans, collard greens, nuts, and seeds. She is asymptomatic today in the clinic. Laboratory results are pending for further evaluation.    IFG (impaired fasting glucose) -     Hemoglobin A1c  Vitamin D  deficiency -     VITAMIN D  25 Hydroxy (Vit-D Deficiency, Fractures)  TSH (thyroid-stimulating hormone deficiency) -     TSH + free T4  Other hyperlipidemia -     Lipid panel -     CMP14+EGFR -     CBC with Differential/Platelet   Note: This chart has been completed using Engineer, civil (consulting) software, and while attempts have been made to ensure accuracy, certain words and phrases may not be transcribed as intended.   Follow-up: Return in about 2 weeks (around 12/10/2023) for BP.   Renna Kilmer, FNP

## 2023-11-26 NOTE — Patient Instructions (Addendum)
 I appreciate the opportunity to provide care to you today!    Follow up:  2 weeks for Nurse visit for BP/ 4 months f/u  Labs: please stop by the lab today to get your blood drawn (CBC, CMP, TSH, Lipid profile, HgA1c, Vit D)  Iron  Rich Food: Whole-wheat breads, cereals,  bagels, Dried apricots, dried figs,Spinach, soybeans, Asparagus, Brussels sprouts, mushrooms, green peas, white or sweet potato with the skin, tomato sauce, beets, beans such as garbanzo or lima, Greens such as collard, turnip, kale, beet, or Swiss chard. Beef, veal, lamb, pork, beef or chicken liver, clams, sardines, oysters, shrimp, tofu, baked beans with pork, lentils, tahini, tempeh. Beans such as kidney, lima, navy, or white.Chicken breast, [place], eggs, fresh or canned tuna or mackerel.Molasses, soy milk, seeds such as sesame or sunflower. Nuts such as almonds, pistachios, cashews, and walnuts.Pumpkin seeds.  For a Healthier YOU, I Recommend: Reducing your intake of sugar, sodium, carbohydrates, and saturated fats. Increasing your fiber intake by incorporating more whole grains, fruits, and vegetables into your meals. Setting healthy goals with a focus on lowering your consumption of carbs, sugar, and unhealthy fats. Adding variety to your diet by including a wide range of fruits and vegetables. Cutting back on soda and limiting processed foods as much as possible. Staying active: In addition to taking your weight loss medication, aim for at least 150 minutes of moderate-intensity physical activity each week for optimal results.   Please follow up if your symptoms worsen or fail to improve.     Please continue to a heart-healthy diet and increase your physical activities. Try to exercise for at least five days a week.    It was a pleasure to see you and I look forward to continuing to work together on your health and well-being. Please do not hesitate to call the office if you need care or have questions about  your care.  In case of emergency, please visit the Emergency Department for urgent care, or contact our clinic at [phone] to schedule an appointment. We're here to help you!   Have a wonderful day and week. With Gratitude, Raheen Capili MSN, FNP-BC

## 2023-11-26 NOTE — Assessment & Plan Note (Signed)
 Uncontrolled Blood Pressure The patient reports medication noncompliance, stating that she takes her blood pressure medication as needed and admits to not having taken her medication today. She was encouraged to adhere to her medication regimen, follow a low-sodium diet, and increase physical activity. She was also advised to follow up in two weeks for a nurse visit and blood pressure recheck. BP Readings from Last 3 Encounters:  11/26/23 (!) 142/78  06/29/23 (!) 140/82  01/20/23 136/84

## 2023-11-26 NOTE — Assessment & Plan Note (Signed)
 The patient reports taking her iron  supplement on an as-needed basis but has been increasing her intake of iron -rich foods such as eggs, chicken, beans, collard greens, nuts, and seeds. She is asymptomatic today in the clinic. Laboratory results are pending for further evaluation.

## 2023-12-08 LAB — CBC WITH DIFFERENTIAL/PLATELET
Basophils Absolute: 0 10*3/uL (ref 0.0–0.2)
Basos: 0 %
EOS (ABSOLUTE): 0.1 10*3/uL (ref 0.0–0.4)
Eos: 2 %
Hematocrit: 36.4 % (ref 34.0–46.6)
Hemoglobin: 10.8 g/dL — ABNORMAL LOW (ref 11.1–15.9)
Immature Grans (Abs): 0 10*3/uL (ref 0.0–0.1)
Immature Granulocytes: 0 %
Lymphocytes Absolute: 2.8 10*3/uL (ref 0.7–3.1)
Lymphs: 42 %
MCH: 22.1 pg — ABNORMAL LOW (ref 26.6–33.0)
MCHC: 29.7 g/dL — ABNORMAL LOW (ref 31.5–35.7)
MCV: 74 fL — ABNORMAL LOW (ref 79–97)
Monocytes Absolute: 0.3 10*3/uL (ref 0.1–0.9)
Monocytes: 5 %
Neutrophils Absolute: 3.5 10*3/uL (ref 1.4–7.0)
Neutrophils: 51 %
Platelets: 303 10*3/uL (ref 150–450)
RBC: 4.89 x10E6/uL (ref 3.77–5.28)
RDW: 16.1 % — ABNORMAL HIGH (ref 11.7–15.4)
WBC: 6.8 10*3/uL (ref 3.4–10.8)

## 2023-12-08 LAB — LIPID PANEL
Chol/HDL Ratio: 5.3 ratio — ABNORMAL HIGH (ref 0.0–4.4)
Cholesterol, Total: 196 mg/dL (ref 100–199)
HDL: 37 mg/dL — ABNORMAL LOW (ref >39)
LDL Chol Calc (NIH): 134 mg/dL — ABNORMAL HIGH (ref 0–99)
Triglycerides: 136 mg/dL (ref 0–149)
VLDL Cholesterol Cal: 25 mg/dL (ref 5–40)

## 2023-12-08 LAB — TSH+FREE T4
Free T4: 1.17 ng/dL (ref 0.82–1.77)
TSH: 1.17 u[IU]/mL (ref 0.450–4.500)

## 2023-12-08 LAB — CMP14+EGFR
ALT: 14 IU/L (ref 0–32)
AST: 17 IU/L (ref 0–40)
Albumin: 4.4 g/dL (ref 3.9–4.9)
Alkaline Phosphatase: 61 IU/L (ref 44–121)
BUN/Creatinine Ratio: 10 (ref 9–23)
BUN: 11 mg/dL (ref 6–20)
Bilirubin Total: <0.2 mg/dL (ref 0.0–1.2)
CO2: 18 mmol/L — ABNORMAL LOW (ref 20–29)
Calcium: 9.8 mg/dL (ref 8.7–10.2)
Chloride: 103 mmol/L (ref 96–106)
Creatinine, Ser: 1.09 mg/dL — ABNORMAL HIGH (ref 0.57–1.00)
Globulin, Total: 3.2 g/dL (ref 1.5–4.5)
Glucose: 81 mg/dL (ref 70–99)
Potassium: 4.4 mmol/L (ref 3.5–5.2)
Sodium: 138 mmol/L (ref 134–144)
Total Protein: 7.6 g/dL (ref 6.0–8.5)
eGFR: 68 mL/min/{1.73_m2} (ref >59)

## 2023-12-08 LAB — VITAMIN D 25 HYDROXY (VIT D DEFICIENCY, FRACTURES): Vit D, 25-Hydroxy: 18.6 ng/mL — ABNORMAL LOW (ref 30.0–100.0)

## 2023-12-08 LAB — HEMOGLOBIN A1C
Est. average glucose Bld gHb Est-mCnc: 114 mg/dL
Hgb A1c MFr Bld: 5.6 % (ref 4.8–5.6)

## 2023-12-09 ENCOUNTER — Ambulatory Visit

## 2023-12-09 NOTE — Progress Notes (Signed)
 Patient is in office today for a nurse visit for Blood Pressure Check. Patient blood pressure was 136/85, Patient No chest pain, No shortness of breath, No dyspnea on exertion, No orthopnea, No paroxysmal nocturnal dyspnea, No edema, No palpitations, No syncope

## 2023-12-11 ENCOUNTER — Telehealth: Admitting: Family Medicine

## 2023-12-11 DIAGNOSIS — N61 Mastitis without abscess: Secondary | ICD-10-CM

## 2023-12-11 DIAGNOSIS — S21039A Puncture wound without foreign body of unspecified breast, initial encounter: Secondary | ICD-10-CM

## 2023-12-11 MED ORDER — DOXYCYCLINE HYCLATE 100 MG PO TABS: 100.0000 mg | ORAL_TABLET | Freq: Two times a day (BID) | ORAL | 0 refills | Status: AC

## 2023-12-11 NOTE — Patient Instructions (Signed)
 Erysipelas  Erysipelas is an infection that affects the skin and tissues near the surface of the skin. It causes the skin to become red, swollen, and painful. The infection is most common on the legs but may also affect other areas, such as the face. With treatment, the infection usually goes away in a few days. If not treated, the infection can spread or lead to other problems, such as collections of pus (abscesses). What are the causes? This condition is caused by bacteria. Most often, it is caused by bacteria called streptococci.  Bacteria may get into the skin through a break in the skin, such as: A cut or scrape. An incision from surgery. A burn. An insect bite. An open sore. A crack in the skin. Sometimes, it is not known how the bacteria infected the skin. What increases the risk? You are more likely to develop this condition if you: Are a young child. Are an older adult. Have a weakened disease-fighting system (immune system), such as having HIV or AIDS. Have diabetes. Drink a lot of alcohol . Had recent surgery. Have a yeast infection of the skin. Have swollen legs. What are the signs or symptoms? The infection causes a reddened area on the skin. This reddened area may: Be painful and swollen. Have a clear border around it. Feel itchy and hot. Develop blisters or abscesses. Other symptoms may include: Fever. Chills. Nausea and vomiting. Swollen glands (lymph nodes), such as in the neck. Headache. Feeling tired (fatigue). Loss of appetite. How is this diagnosed? This condition is diagnosed based on: A physical exam. Your health care provider will examine your skin closely. Your symptoms and medical history. How is this treated? This condition is treated with antibiotic medicine. Symptoms usually get better within a few days after starting antibiotics. Follow these instructions at home: Medicines Take other over-the-counter and prescription medicines only as told by  your health care provider. Take your antibiotic medicine as told by your health care provider. Do not stop taking the antibiotic even if your condition starts to improve. Ask your health care provider if it is safe for you to take medicines for pain as needed, such as acetaminophen  or ibuprofen. General instructions  If the affected area is on an arm or leg, raise (elevate) the affected arm or leg above the level of your heart while you are sitting or lying down. Do not put any creams or lotions on the affected area of your skin unless your health care provider tells you to do that. Do not share bedding, towels, or washcloths (linens) with other people. Use only your own linens to prevent the infection from spreading to others. Follow instructions from your health care provider about how to take care of your wound. Make sure you: Wash your hands with soap and water  for at least 20 seconds before and after you change your bandage (dressing). If soap and water  are not available, use hand sanitizer. Change your dressing as told by your health care provider. Keep all follow-up visits. This is important. Contact a health care provider if: Your symptoms do not improve within 1-2 days of starting treatment. You develop new symptoms. You have a fever. You feel generally sick (malaise) with muscle aches and pains. Get help right away if: Your symptoms get worse. You develop vomiting or diarrhea that does not go away. Your red area gets larger or turns dark in color. You notice red streaks coming from the infected area. Summary Erysipelas is an infection affecting  the skin and tissues near the surface of the skin. It causes the skin to become red, swollen, and painful. This condition is caused by bacteria. Most often, it is caused by bacteria called streptococci. Bacteria may enter through a break in the skin. Sometimes, it is not known how the bacteria infected the skin. This condition is treated  with antibiotic medicine. Symptoms usually get better within a few days after starting antibiotics. This information is not intended to replace advice given to you by your health care provider. Make sure you discuss any questions you have with your health care provider. Document Revised: 04/16/2023 Document Reviewed: 04/16/2023 Elsevier Patient Education  2024 [employer].

## 2023-12-11 NOTE — Progress Notes (Signed)
 Virtual Visit Consent   Angela Mcclure, you are scheduled for a virtual visit with a [facility] provider today. Just as with appointments in the office, your consent must be obtained to participate. Your consent will be active for this visit and any virtual visit you may have with one of our providers in the next 365 days. If you have a MyChart account, a copy of this consent can be sent to you electronically.  As this is a virtual visit, video technology does not allow for your provider to perform a traditional examination. This may limit your provider's ability to fully assess your condition. If your provider identifies any concerns that need to be evaluated in person or the need to arrange testing (such as labs, EKG, etc.), we will make arrangements to do so. Although advances in technology are sophisticated, we cannot ensure that it will always work on either your end or our end. If the connection with a video visit is poor, the visit may have to be switched to a telephone visit. With either a video or telephone visit, we are not always able to ensure that we have a secure connection.  By engaging in this virtual visit, you consent to the provision of healthcare and authorize for your insurance to be billed (if applicable) for the services provided during this visit. Depending on your insurance coverage, you may receive a charge related to this service.  I need to obtain your verbal consent now. Are you willing to proceed with your visit today? Angela Mcclure has provided verbal consent on 12/11/2023 for a virtual visit (video or telephone). Loa Lamp, FNP  Date: 12/11/2023 11:33 AM   Virtual Visit via Video Note   I, Loa Lamp, connected with  Angela Mcclure  ([id number], 16-Feb-1991) on 12/11/23 at 11:30 AM EDT by a video-enabled telemedicine application and verified that I am speaking with the correct person using two identifiers.  Location: Patient: Virtual Visit Location Patient:  Home Provider: Virtual Visit Location Provider: Home Office   I discussed the limitations of evaluation and management by telemedicine and the availability of in person appointments. The patient expressed understanding and agreed to proceed.    History of Present Illness: Angela Mcclure is a 33 y.o. who identifies as a female who was assigned female at birth, and is being seen today for infected rt nipple piercing with redness warmth and drainage. Reports doxycycline  worked well in the past. She also has a cerumen impaction. Angela Mcclure  HPI: HPI  Problems:  Patient Active Problem List   Diagnosis Date Noted   Impacted cerumen of both ears 06/29/2023   Cough 01/20/2023   Menorrhagia with regular cycle 08/19/2021   Mass of lower outer quadrant of left breast 08/19/2021   Infected pierced nipple 07/12/2021   Mass of nipple 07/12/2021   Cellulitis 04/14/2021   Anxiety 04/17/2020   Nipple infection in female 01/18/2020   Iron  deficiency anemia 08/24/2019   Fatigue 07/12/2019   Family history of diabetes mellitus in mother 07/12/2019   Vitamin D  deficiency 07/12/2019   Hypertension 07/12/2019   Morbid obesity (HCC) 10/18/2013   Asthma 08/18/2012    Allergies: No Known Allergies Medications:  Current Outpatient Medications:    doxycycline  (VIBRA -TABS) 100 MG tablet, Take 1 tablet (100 mg total) by mouth 2 (two) times daily for 10 days., Disp: 20 tablet, Rfl: 0   Cholecalciferol  (VITAMIN D3) 25 MCG (1000 UT) CAPS, Take 1 capsule (1,000 Units total) by mouth daily. (  Patient not taking: Reported on 11/26/2023), Disp: 60 capsule, Rfl: 3   Iron , Ferrous Sulfate , 325 (65 Fe) MG TABS, Take 325 mg by mouth daily., Disp: 30 tablet, Rfl: 1   olmesartan  (BENICAR ) 20 MG tablet, Take 1 tablet (20 mg total) by mouth daily., Disp: 90 tablet, Rfl: 1  Observations/Objective: Patient is well-developed, well-nourished in no acute distress.  Resting comfortably  at home.  Head is normocephalic, atraumatic.  No  labored breathing.  Speech is clear and coherent with logical content.  Patient is alert and oriented at baseline.    Assessment and Plan: There are no diagnoses linked to this encounter. Remove piercing, clean with peroxide, go to UC for worsening sx and for cerumen impaction.  Follow Up Instructions: I discussed the assessment and treatment plan with the patient. The patient was provided an opportunity to ask questions and all were answered. The patient agreed with the plan and demonstrated an understanding of the instructions.  A copy of instructions were sent to the patient via MyChart unless otherwise noted below.     The patient was advised to call back or seek an in-person evaluation if the symptoms worsen or if the condition fails to improve as anticipated.    Glynnis Gavel, FNP

## 2023-12-18 ENCOUNTER — Ambulatory Visit: Payer: Self-pay | Admitting: Family Medicine

## 2023-12-18 DIAGNOSIS — E559 Vitamin D deficiency, unspecified: Secondary | ICD-10-CM

## 2023-12-18 MED ORDER — VITAMIN D (ERGOCALCIFEROL) 1.25 MG (50000 UNIT) PO CAPS: 50000.0000 [IU] | ORAL_CAPSULE | ORAL | 1 refills | Status: AC

## 2024-03-24 ENCOUNTER — Other Ambulatory Visit: Payer: Self-pay | Admitting: Family Medicine

## 2024-03-24 DIAGNOSIS — I10 Essential (primary) hypertension: Secondary | ICD-10-CM

## 2024-03-31 ENCOUNTER — Encounter: Payer: Self-pay | Admitting: Family Medicine

## 2024-03-31 ENCOUNTER — Ambulatory Visit: Admitting: Family Medicine

## 2024-03-31 VITALS — BP 134/80 | HR 74 | Resp 18 | Ht 65.0 in | Wt 251.1 lb

## 2024-03-31 DIAGNOSIS — E559 Vitamin D deficiency, unspecified: Secondary | ICD-10-CM

## 2024-03-31 DIAGNOSIS — R7301 Impaired fasting glucose: Secondary | ICD-10-CM | POA: Diagnosis not present

## 2024-03-31 DIAGNOSIS — E038 Other specified hypothyroidism: Secondary | ICD-10-CM | POA: Diagnosis not present

## 2024-03-31 DIAGNOSIS — E7849 Other hyperlipidemia: Secondary | ICD-10-CM

## 2024-03-31 DIAGNOSIS — D508 Other iron deficiency anemias: Secondary | ICD-10-CM | POA: Diagnosis not present

## 2024-03-31 NOTE — Assessment & Plan Note (Addendum)
 Encourage adequate hydration and regular balanced meals to help prevent dizziness or presyncope episodes. Pending  CBC to assess for recurrent or persistent iron  deficiency anemia. Continue iron  supplementation  Advise the patient to rise slowly from sitting or lying positions to minimize orthostatic symptoms. Educate the patient on recognizing warning signs such as prolonged dizziness, chest pain, shortness of breath, or syncope, and to seek medical attention if these occur.

## 2024-03-31 NOTE — Progress Notes (Signed)
 Established Patient Office Visit  Subjective:  Patient ID: Angela Mcclure, female    DOB: 1991-01-25  Age: 33 y.o. MRN: [id number]  CC:  Chief Complaint  Patient presents with   Hypertension    Follow up    Fatigue    Pt states she has been feeling faint a couple times this past week. States it feels like she might pass out. Denies dizziness, blurred vision, chest pains    HPI Angela Mcclure is a 33 y.o. female with past medical history of Hypertension, obesity, fatigue presents for f/u of  chronic medical conditions. For the details of today's visit, please refer to the assessment and plan.    The patient reports intermittent episodes of feeling like she is going to pass out, lasting only a few seconds before resolving spontaneously. These episodes occur infrequently and are described as coming and going. The patient has a history of iron  deficiency anemia.   Past Medical History:  Diagnosis Date   Anxiety    Asthma    History of gestational hypertension 10/18/2013   Postpartum hypertension 05/14/2015    Past Surgical History:  Procedure Laterality Date   NO PAST SURGERIES      Family History  Problem Relation Age of Onset   Diabetes Other    Hypertension Other    Stroke Other    Diabetes Mother    Hypertension Mother     Social History   Socioeconomic History   Marital status: Significant Other    Spouse name: Not on file   Number of children: 3   Years of education: Not on file   Highest education level: 12th grade  Occupational History   Occupation: Aided    Comment: [employer]   Tobacco Use   Smoking status: Never   Smokeless tobacco: Never  Vaping Use   Vaping status: Never Used  Substance and Sexual Activity   Alcohol use: No   Drug use: Never   Sexual activity: Yes    Birth control/protection: None  Other Topics Concern   Not on file  Social History Narrative   Lives with 3 children father of children    4- [name]    5- Aila  Girl   6- [name]       Enjoys: shopping, eating out, relaxing, watching movies       Diet: eats all food groups   Caffeine: sweet tea    Water: at times 16 oz       Wears seat belt   Smoke and carbon monoxide detectors    Does not use phone while driving       Social Drivers of Corporate investment banker Strain: Low Risk  (03/27/2021)   Overall Financial Resource Strain (CARDIA)    Difficulty of Paying Living Expenses: Not hard at all  Food Insecurity: No Food Insecurity (03/27/2021)   Hunger Vital Sign    Worried About Running Out of Food in the Last Year: Never true    Ran Out of Food in the Last Year: Never true  Transportation Needs: No Transportation Needs (03/27/2021)   PRAPARE - Administrator, Civil Service (Medical): No    Lack of Transportation (Non-Medical): No  Physical Activity: Insufficiently Active (03/27/2021)   Exercise Vital Sign    Days of Exercise per Week: 3 days    Minutes of Exercise per Session: 20 min  Stress: No Stress Concern Present (03/27/2021)   [place]  [employer] - Occupational Stress Questionnaire    Feeling of Stress : Not at all  Social Connections: Socially Isolated (03/27/2021)   Social Connection and Isolation Panel    Frequency of Communication with Friends and Family: More than three times a week    Frequency of Social Gatherings with Friends and Family: Three times a week    Attends Religious Services: Never    Active Member of Clubs or Organizations: No    Attends Banker Meetings: Patient declined    Marital Status: Never married  Intimate Partner Violence: Not At Risk (03/27/2021)   Humiliation, Afraid, Rape, and Kick questionnaire    Fear of Current or Ex-Partner: No    Emotionally Abused: No    Physically Abused: No    Sexually Abused: No    Outpatient Medications Prior to Visit  Medication Sig Dispense Refill   Iron , Ferrous Sulfate , 325 (65 Fe) MG TABS Take 325 mg by  mouth daily. 30 tablet 1   olmesartan  (BENICAR ) 20 MG tablet TAKE ONE TABLET BY MOUTH EVERY DAY 90 tablet 1   Prenatal Vit-Fe Fumarate-FA (PRENATAL MULTIVITAMIN) TABS tablet Take 1 tablet by mouth daily at 12 noon.     Vitamin D , Ergocalciferol , (DRISDOL ) 1.25 MG (50000 UNIT) CAPS capsule Take 1 capsule (50,000 Units total) by mouth every 7 (seven) days. 27 capsule 1   Cholecalciferol  (VITAMIN D3) 25 MCG (1000 UT) CAPS Take 1 capsule (1,000 Units total) by mouth daily. (Patient not taking: Reported on 03/31/2024) 60 capsule 3   No facility-administered medications prior to visit.    No Known Allergies  ROS Review of Systems  Constitutional:  Negative for chills and fever.  Eyes:  Negative for visual disturbance.  Respiratory:  Negative for chest tightness and shortness of breath.   Neurological:  Negative for dizziness and headaches.      Objective:    Physical Exam HENT:     Head: Normocephalic.     Mouth/Throat:     Mouth: Mucous membranes are moist.  Cardiovascular:     Rate and Rhythm: Normal rate.     Heart sounds: Normal heart sounds.  Pulmonary:     Effort: Pulmonary effort is normal.     Breath sounds: Normal breath sounds.  Neurological:     Mental Status: She is alert.     BP 134/80   Pulse 74   Resp 18   Ht 5' 5 (1.651 m)   Wt 251 lb 1.3 oz (113.9 kg)   LMP 03/12/2024 (Exact Date)   SpO2 95%   BMI 41.78 kg/m  Wt Readings from Last 3 Encounters:  03/31/24 251 lb 1.3 oz (113.9 kg)  11/26/23 259 lb (117.5 kg)  06/29/23 257 lb 1.9 oz (116.6 kg)    Lab Results  Component Value Date   TSH 1.170 12/07/2023   Lab Results  Component Value Date   WBC 6.8 12/07/2023   HGB 10.8 (L) 12/07/2023   HCT 36.4 12/07/2023   MCV 74 (L) 12/07/2023   PLT 303 12/07/2023   Lab Results  Component Value Date   NA 138 12/07/2023   K 4.4 12/07/2023   CO2 18 (L) 12/07/2023   GLUCOSE 81 12/07/2023   BUN 11 12/07/2023   CREATININE 1.09 (H) 12/07/2023   BILITOT  <0.2 12/07/2023   ALKPHOS 61 12/07/2023   AST 17 12/07/2023   ALT 14 12/07/2023   PROT 7.6 12/07/2023   ALBUMIN 4.4 12/07/2023   CALCIUM 9.8 12/07/2023  ANIONGAP 10 07/24/2016   EGFR 68 12/07/2023   Lab Results  Component Value Date   CHOL 196 12/07/2023   Lab Results  Component Value Date   HDL 37 (L) 12/07/2023   Lab Results  Component Value Date   LDLCALC 134 (H) 12/07/2023   Lab Results  Component Value Date   TRIG 136 12/07/2023   Lab Results  Component Value Date   CHOLHDL 5.3 (H) 12/07/2023   Lab Results  Component Value Date   HGBA1C 5.6 12/07/2023      Assessment & Plan:  Other iron  deficiency anemia Assessment & Plan: Encourage adequate hydration and regular balanced meals to help prevent dizziness or presyncope episodes. Pending  CBC to assess for recurrent or persistent iron  deficiency anemia. Continue iron  supplementation  Advise the patient to rise slowly from sitting or lying positions to minimize orthostatic symptoms. Educate the patient on recognizing warning signs such as prolonged dizziness, chest pain, shortness of breath, or syncope, and to seek medical attention if these occur.      IFG (impaired fasting glucose) -     Hemoglobin A1c  Vitamin D  deficiency -     VITAMIN D  25 Hydroxy (Vit-D Deficiency, Fractures)  TSH (thyroid-stimulating hormone deficiency) -     TSH + free T4  Other hyperlipidemia -     Lipid panel -     CMP14+EGFR -     CBC with Differential/Platelet  Note: This chart has been completed using Engineer, civil (consulting) software, and while attempts have been made to ensure accuracy, certain words and phrases may not be transcribed as intended.    Follow-up: Return in about 5 months (around 08/29/2024).   Kasee Hantz  Z Bacchus, FNP

## 2024-03-31 NOTE — Patient Instructions (Addendum)
 I appreciate the opportunity to provide care to you today!    Follow up:  5 months  Fasting Labs: please stop by the lab during the week to get your blood drawn (CBC, CMP, TSH, Lipid profile, HgA1c, Vit D)  For a Healthier YOU, I Recommend: Reducing your intake of sugar, sodium, carbohydrates, and saturated fats. Increasing your fiber intake by incorporating more whole grains, fruits, and vegetables into your meals. Setting healthy goals with a focus on lowering your consumption of carbs, sugar, and unhealthy fats. Adding variety to your diet by including a wide range of fruits and vegetables. Cutting back on soda and limiting processed foods as much as possible. Staying active: In addition to taking your weight loss medication, aim for at least 150 minutes of moderate-intensity physical activity each week for optimal results.   Please follow up if your symptoms worsen or fail to improve.   Please continue to a heart-healthy diet and increase your physical activities. Try to exercise for at least five days a week.    It was a pleasure to see you and I look forward to continuing to work together on your health and well-being. Please do not hesitate to call the office if you need care or have questions about your care.  In case of emergency, please visit the Emergency Department for urgent care, or contact our clinic at [phone] to schedule an appointment. We're here to help you!   Have a wonderful day and week. With Gratitude, Meade JENEANE Gerlach MSN, FNP-BC, PMHNP-BC

## 2024-04-05 ENCOUNTER — Telehealth: Payer: Self-pay

## 2024-04-05 LAB — TSH+FREE T4
Free T4: 1.18 ng/dL (ref 0.82–1.77)
TSH: 1.34 u[IU]/mL (ref 0.450–4.500)

## 2024-04-05 LAB — CBC WITH DIFFERENTIAL/PLATELET
Basophils Absolute: 0 x10E3/uL (ref 0.0–0.2)
Basos: 0 %
EOS (ABSOLUTE): 0.1 x10E3/uL (ref 0.0–0.4)
Eos: 1 %
Hematocrit: 37.4 % (ref 34.0–46.6)
Hemoglobin: 11 g/dL — ABNORMAL LOW (ref 11.1–15.9)
Immature Grans (Abs): 0 x10E3/uL (ref 0.0–0.1)
Immature Granulocytes: 0 %
Lymphocytes Absolute: 2.9 x10E3/uL (ref 0.7–3.1)
Lymphs: 40 %
MCH: 21.4 pg — ABNORMAL LOW (ref 26.6–33.0)
MCHC: 29.4 g/dL — ABNORMAL LOW (ref 31.5–35.7)
MCV: 73 fL — ABNORMAL LOW (ref 79–97)
Monocytes Absolute: 0.4 x10E3/uL (ref 0.1–0.9)
Monocytes: 6 %
Neutrophils Absolute: 3.8 x10E3/uL (ref 1.4–7.0)
Neutrophils: 53 %
Platelets: 271 x10E3/uL (ref 150–450)
RBC: 5.15 x10E6/uL (ref 3.77–5.28)
RDW: 16.2 % — ABNORMAL HIGH (ref 11.7–15.4)
WBC: 7.2 x10E3/uL (ref 3.4–10.8)

## 2024-04-05 LAB — VITAMIN D 25 HYDROXY (VIT D DEFICIENCY, FRACTURES): Vit D, 25-Hydroxy: 31.9 ng/mL (ref 30.0–100.0)

## 2024-04-05 LAB — CMP14+EGFR
ALT: 15 IU/L (ref 0–32)
AST: 15 IU/L (ref 0–40)
Albumin: 4.4 g/dL (ref 3.9–4.9)
Alkaline Phosphatase: 56 IU/L (ref 41–116)
BUN/Creatinine Ratio: 16 (ref 9–23)
BUN: 14 mg/dL (ref 6–20)
Bilirubin Total: 0.3 mg/dL (ref 0.0–1.2)
CO2: 21 mmol/L (ref 20–29)
Calcium: 9.8 mg/dL (ref 8.7–10.2)
Chloride: 100 mmol/L (ref 96–106)
Creatinine, Ser: 0.9 mg/dL (ref 0.57–1.00)
Globulin, Total: 3.2 g/dL (ref 1.5–4.5)
Glucose: 77 mg/dL (ref 70–99)
Potassium: 4.5 mmol/L (ref 3.5–5.2)
Sodium: 135 mmol/L (ref 134–144)
Total Protein: 7.6 g/dL (ref 6.0–8.5)
eGFR: 86 mL/min/1.73 (ref >59)

## 2024-04-05 LAB — HEMOGLOBIN A1C
Est. average glucose Bld gHb Est-mCnc: 114 mg/dL
Hgb A1c MFr Bld: 5.6 % (ref 4.8–5.6)

## 2024-04-05 LAB — LIPID PANEL
Chol/HDL Ratio: 4.7 ratio — ABNORMAL HIGH (ref 0.0–4.4)
Cholesterol, Total: 199 mg/dL (ref 100–199)
HDL: 42 mg/dL (ref >39)
LDL Chol Calc (NIH): 132 mg/dL — ABNORMAL HIGH (ref 0–99)
Triglycerides: 140 mg/dL (ref 0–149)
VLDL Cholesterol Cal: 25 mg/dL (ref 5–40)

## 2024-04-05 NOTE — Telephone Encounter (Signed)
 Copied from CRM [id number]. Topic: Appointments - Transfer of Care >> Apr 05, 2024  2:44 PM Fonda T wrote: Pt is requesting to transfer FROM: Meade Gerlach, FNP ([place]) Pt is requesting to transfer TO: Leita Mounts, FNP Reason for requested transfer: per patient preference It is the responsibility of the team the patient would like to transfer to Francie Mulberry, FNP) to reach out to the patient if for any reason this transfer is not acceptable.

## 2024-04-05 NOTE — Telephone Encounter (Signed)
 That's fine

## 2024-04-08 ENCOUNTER — Other Ambulatory Visit: Payer: Self-pay

## 2024-04-08 ENCOUNTER — Telehealth: Payer: Self-pay

## 2024-04-08 MED ORDER — PRENATAL PLUS VITAMIN/MINERAL 27-1 MG PO TABS: 1.0000 | ORAL_TABLET | Freq: Every day | ORAL | 11 refills | Status: AC

## 2024-04-08 NOTE — Telephone Encounter (Signed)
 Copied from CRM [id number]. Topic: Clinical - Medication Question >> Apr 08, 2024  1:11 PM Nathanel BROCKS wrote: Reason for CRM: can Leita please write an rx for prenatal vitamins? Please advise pt.

## 2024-06-11 ENCOUNTER — Ambulatory Visit: Payer: Self-pay | Admitting: Family Medicine

## 2024-08-29 ENCOUNTER — Ambulatory Visit: Admitting: Family Medicine
